# Patient Record
Sex: Female | Born: 1980 | Race: White | Hispanic: No | Marital: Married | Smoking: Former smoker
Health system: Southern US, Community
[De-identification: ages and names within clinical notes are randomized; demographics above are authoritative.]

## PROBLEM LIST (undated history)

## (undated) ENCOUNTER — Inpatient Hospital Stay (HOSPITAL_COMMUNITY): Payer: Self-pay

## (undated) DIAGNOSIS — K648 Other hemorrhoids: Secondary | ICD-10-CM

## (undated) DIAGNOSIS — G709 Myoneural disorder, unspecified: Secondary | ICD-10-CM

## (undated) DIAGNOSIS — Z8619 Personal history of other infectious and parasitic diseases: Secondary | ICD-10-CM

## (undated) DIAGNOSIS — C801 Malignant (primary) neoplasm, unspecified: Secondary | ICD-10-CM

## (undated) DIAGNOSIS — C3490 Malignant neoplasm of unspecified part of unspecified bronchus or lung: Secondary | ICD-10-CM

## (undated) DIAGNOSIS — R6882 Decreased libido: Secondary | ICD-10-CM

## (undated) HISTORY — PX: WISDOM TOOTH EXTRACTION: SHX21

## (undated) HISTORY — DX: Personal history of other infectious and parasitic diseases: Z86.19

## (undated) HISTORY — DX: Myoneural disorder, unspecified: G70.9

## (undated) HISTORY — DX: Malignant (primary) neoplasm, unspecified: C80.1

## (undated) HISTORY — DX: Decreased libido: R68.82

## (undated) HISTORY — PX: LUNG REMOVAL, PARTIAL: SHX233

## (undated) HISTORY — DX: Other hemorrhoids: K64.8

## (undated) HISTORY — DX: Malignant neoplasm of unspecified part of unspecified bronchus or lung: C34.90

---

## 2001-06-12 ENCOUNTER — Encounter: Admission: RE | Admit: 2001-06-12 | Discharge: 2001-06-12 | Payer: Self-pay | Admitting: Family Medicine

## 2001-06-12 ENCOUNTER — Encounter: Payer: Self-pay | Admitting: Family Medicine

## 2007-09-13 ENCOUNTER — Inpatient Hospital Stay (HOSPITAL_COMMUNITY): Admission: AD | Admit: 2007-09-13 | Discharge: 2007-09-16 | Payer: Self-pay | Admitting: Obstetrics and Gynecology

## 2007-09-13 ENCOUNTER — Inpatient Hospital Stay (HOSPITAL_COMMUNITY): Admission: AD | Admit: 2007-09-13 | Discharge: 2007-09-13 | Payer: Self-pay | Admitting: Obstetrics

## 2010-08-25 NOTE — Op Note (Signed)
NAMEGREGORY, Donna Higgins                 ACCOUNT NO.:  [id number]   MEDICAL RECORD NO.:  [id number]          PATIENT TYPE:  INP   LOCATION:  9124                          FACILITY:  [facility]   PHYSICIAN:  Gerri Spore B. Earlene Plater, M.D.  DATE OF BIRTH:  [birth year]   DATE OF PROCEDURE:  DATE OF DISCHARGE:                               OPERATIVE REPORT   PREOPERATIVE DIAGNOSES:  1. A 38-week intrauterine pregnancy.  2. Breech presentation.  3. Labor.   POSTOPERATIVE DIAGNOSES:  1. A 38-week intrauterine pregnancy.  2. Breech presentation.  3. Labor.   PROCEDURE:  Primary low-transverse cesarean section.   SURGEON:  Chester Holstein. Earlene Plater, MD   ASSISTANT:  Marlinda Mike, CNM   ANESTHESIA:  Spinal.   FINDINGS:  Homero Fellers breech female.  Apgars 9 and 9, pH 7.33, and weight 8  pounds 2 ounces.   SPECIMENS:  None.   BLOOD LOSS:  600.   COMPLICATIONS:  None.   FINDINGS:  Normal-appearing uterus, tubes, and ovaries.   INDICATIONS:  The patient presented at 38+ weeks, breech presentation,  and spontaneous labor.  Advised the risks of surgery including  infection, bleeding, and damage to surrounding organs.   PROCEDURE:  The patient was taken to the operating room and spinal  anesthesia was obtained.  Prepped and draped in standard fashion.  Foley  catheter inserted into the bladder.  A transverse incision was made in  lower abdomen and down to the fascia in the midline.  Just on each side  of the midline, the fascia was nicked with a scalpel.  The remainder of  the subcutaneous tissue was taken away bluntly and divided bluntly.  The  fascia was then opened bluntly.  Posterior sheath and peritoneum were  elevated and entered bluntly.  Bladder blade inserted and the uterus was  inspected.  Breech presentation was palpable and it felt like, not  enough room was present to allow easy delivery of the fetus, therefore,  the rectus muscle appeared to be the spot of resistance.  Therefore, the  middle third of each  rectus muscle was separated with the Bovie.  A  uterine incision was made in the midline in low transverse fashion with  a knife and extended laterally, bluntly.  Clear fluid at amniotomy.  The  breech was elevated to the incision and with fundal pressure delivered  without difficulty.  Each leg was delivered by flexion at the knees and  hips.  Traction was placed at the hips to deliver the infant to the  level of scapula, each arm was delivered by flexion of the elbow.  The  head was delivered by the flexion of the neck and fundal pressure.  Nose  and mouth suctioned with the bulbar cord clamped and cut.  Infant handed  off to awaiting pediatricians.  The patient was given 1 g of Ancef just  prior to skin incision.   Placenta was removed by uterine massage and cord traction.  The uterus  was exteriorized and cleared of all clots and debris.  Uterine incision  closed in a running lock and  did show zero chromic.  Hemostasis  obtained.  Tubes, ovaries, and uterus were normal.  Uterus was turned to  the abdomen.  Pelvis irrigated.  Uterine incision was hemostatic.  Fascia was closed in a running stitch of 0-Vicryl.  Subcutaneous tissue  was made hemostatic with the Bovie.  Skin was closed with staples.   The patient had tolerated the procedure well with no complications.  She  was taken to recovery room in awake, alert, and stable condition.  All  counts were correct per the operating room staff.      Gerri Spore B. Earlene Plater, M.D.  Electronically Signed     WBD/MEDQ  D:  09/13/2007  T:  09/14/2007  Job:  [id number]

## 2010-08-28 NOTE — Discharge Summary (Signed)
NAMECOURTLYN, AKI                 ACCOUNT NO.:  [id number]   MEDICAL RECORD NO.:  [id number]          PATIENT TYPE:  INP   LOCATION:  9124                          FACILITY:  [facility]   PHYSICIAN:  Gerri Spore B. Earlene Plater, M.D.  DATE OF BIRTH:  Mar 08, 1981   DATE OF ADMISSION:  09/13/2007  DATE OF DISCHARGE:  09/16/2007                               DISCHARGE SUMMARY   PREOPERATIVE DIAGNOSES:  1. 38 week intrauterine pregnancy.  2. Breech presentation.  3. Spontaneous labor.   DISCHARGE DIAGNOSES:  1. 38 week intrauterine pregnancy.  2. Breech presentation.  3. Spontaneous labor.   PROCEDURE:  Primary low transverse C-section.   HISTORY OF PRESENT ILLNESS:  A 30 year old white female, known breech  presentation, presented 38 weeks to the office in early labor.   HOSPITAL COURSE:  The patient was admitted, delivered by primary low  transverse C-section, frank breech female, 9 and 9 Apgars, pH 7.3, and  weight 8 pounds 2 ounces.   Postoperatively, the patient rapidly regained her ability to ambulate,  void, and tolerate regular diet.  She was discharged home on third  postoperative day in a satisfactory condition.   DISCHARGE INSTRUCTIONS:  Per booklet.   DISCHARGE MEDICATIONS:  Tylox 1-2 p.o. q.4 h. p.r.n. pain and Motrin 800  mg q.8 history. p.r.n. pain.   FOLLOWUP:  6 weeks.   DISPOSITION AT DISCHARGE:  Satisfactory.      Gerri Spore B. Earlene Plater, M.D.  Electronically Signed     WBD/MEDQ  D:  10/06/2007  T:  10/07/2007  Job:  [id number]

## 2011-01-07 LAB — CBC
HCT: 32.4 — ABNORMAL LOW
HCT: 39.5
Hemoglobin: 11.4 — ABNORMAL LOW
Hemoglobin: 13.7
MCHC: 34.7
MCHC: 35.1
MCV: 88.8
MCV: 89
Platelets: 321
Platelets: 345
RBC: 3.64 — ABNORMAL LOW
RBC: 4.45
RDW: 12.9
RDW: 13.3
WBC: 11.1 — ABNORMAL HIGH
WBC: 12.4 — ABNORMAL HIGH

## 2011-01-07 LAB — RPR: RPR Ser Ql: NONREACTIVE

## 2011-01-18 LAB — OB RESULTS CONSOLE GC/CHLAMYDIA
Chlamydia: NEGATIVE
Gonorrhea: NEGATIVE

## 2011-01-18 LAB — OB RESULTS CONSOLE HEPATITIS B SURFACE ANTIGEN: Hepatitis B Surface Ag: NEGATIVE

## 2011-01-18 LAB — OB RESULTS CONSOLE ANTIBODY SCREEN: Antibody Screen: NEGATIVE

## 2011-04-13 LAB — OB RESULTS CONSOLE RPR: RPR: NONREACTIVE

## 2011-04-13 NOTE — L&D Delivery Note (Signed)
Delivery Note  Complete dilation at 1808 Onset of pushing at 1830 FHR second stage - mild tachycardia at 160-175 / minimal to moderate variability / few mild variables Compound presentation with left hand over head - reduced past fetal head by Dr Billy Coast at 2015  Anesthesia: epidural  Delivery of a viable female at 2055 by CNM in ROA position.  Nuchal Cord none - noted short cord. Cord double clamped after cessation of pulsation, cut by FOB.  Cord blood sample collected.  Arterial cord blood sample sent.  Placenta delivered at 2100 intact with 3 VC.  Placenta to L&D for disposal. Uterine tone firm with small bleeding   2nd laceration identified.  Repair 3-0 vicryl interrupted deep closure standard fashion / 4-0 vicryl subcuticular Est. Blood Loss (mL): 300 ml  Complications: none  Small hard sebaceous cyst right vulva - unroofed with 18 gauge / drained moderate amount solidified drainage  Mom to postpartum.  Baby to Mom for bonding.  Marlinda Mike 08/21/2011, 9:28 PM

## 2011-07-13 LAB — OB RESULTS CONSOLE GBS: GBS: NEGATIVE

## 2011-08-11 ENCOUNTER — Inpatient Hospital Stay (HOSPITAL_COMMUNITY)
Admission: AD | Admit: 2011-08-11 | Discharge: 2011-08-12 | DRG: 382 | Disposition: A | Discharge status: Home / Self Care | Payer: BC Managed Care – PPO | Source: Ambulatory Visit | Attending: Obstetrics & Gynecology | Admitting: Obstetrics & Gynecology

## 2011-08-11 ENCOUNTER — Encounter (HOSPITAL_COMMUNITY): Payer: Self-pay | Admitting: *Deleted

## 2011-08-11 DIAGNOSIS — O479 False labor, unspecified: Principal | ICD-10-CM | POA: Diagnosis present

## 2011-08-11 DIAGNOSIS — O34219 Maternal care for unspecified type scar from previous cesarean delivery: Secondary | ICD-10-CM | POA: Diagnosis present

## 2011-08-11 DIAGNOSIS — Z98891 History of uterine scar from previous surgery: Secondary | ICD-10-CM

## 2011-08-11 LAB — CBC
HCT: 36.5 % (ref 36.0–46.0)
Hemoglobin: 12.1 g/dL (ref 12.0–15.0)
MCH: 29 pg (ref 26.0–34.0)
MCHC: 33.2 g/dL (ref 30.0–36.0)
MCV: 87.5 fL (ref 78.0–100.0)
Platelets: 403 10*3/uL — ABNORMAL HIGH (ref 150–400)
RBC: 4.17 MIL/uL (ref 3.87–5.11)
RDW: 13.4 % (ref 11.5–15.5)
WBC: 11.3 10*3/uL — ABNORMAL HIGH (ref 4.0–10.5)

## 2011-08-11 MED ORDER — SODIUM CHLORIDE 0.9 % IJ SOLN: 3.0000 mL | Freq: Two times a day (BID) | INTRAMUSCULAR | Status: DC

## 2011-08-11 MED ORDER — IBUPROFEN 600 MG PO TABS: 600.0000 mg | ORAL_TABLET | Freq: Four times a day (QID) | ORAL | Status: DC | PRN

## 2011-08-11 MED ORDER — SODIUM CHLORIDE 0.9 % IJ SOLN: 3.0000 mL | INTRAMUSCULAR | Status: DC | PRN

## 2011-08-11 MED ORDER — SODIUM CHLORIDE 0.9 % IJ SOLN
INTRAMUSCULAR | Status: AC
  Filled 2011-08-11: qty 3

## 2011-08-11 MED ORDER — OXYTOCIN BOLUS FROM INFUSION
500.0000 mL | Freq: Once | INTRAVENOUS | Status: DC
  Filled 2011-08-11: qty 500

## 2011-08-11 MED ORDER — ACETAMINOPHEN 325 MG PO TABS: 650.0000 mg | ORAL_TABLET | ORAL | Status: DC | PRN

## 2011-08-11 MED ORDER — CITRIC ACID-SODIUM CITRATE 334-500 MG/5ML PO SOLN: 30.0000 mL | ORAL | Status: DC | PRN

## 2011-08-11 MED ORDER — LACTATED RINGERS IV SOLN: 500.0000 mL | INTRAVENOUS | Status: DC | PRN

## 2011-08-11 MED ORDER — LACTATED RINGERS IV SOLN
INTRAVENOUS | Status: DC
  Administered 2011-08-12: via INTRAVENOUS

## 2011-08-11 MED ORDER — OXYTOCIN 20 UNITS IN LACTATED RINGERS INFUSION - SIMPLE: 125.0000 mL/h | Freq: Once | INTRAVENOUS | Status: DC

## 2011-08-11 MED ORDER — LIDOCAINE HCL (PF) 1 % IJ SOLN: 30.0000 mL | INTRAMUSCULAR | Status: DC | PRN

## 2011-08-11 NOTE — MAU Note (Signed)
C/o contractions since 0630; strong since 1800.

## 2011-08-11 NOTE — MAU Provider Note (Signed)
  History     CSN: [id number]  Arrival date and time: 08/11/11 1925 Provider seeing pt at 08/11/2011 at 1930     Chief Complaint  Patient presents with  . Labor Eval   HPI Ctx since 0600 - every 15 minutes until 3pm then every 5-10 minutes / painful ctx  Seen in office at noon today 2-3 cm / 80% / vtx -1                 - sent home with expectant management   No past medical history on file.  No past surgical history on file.  No family history on file.  History  Substance Use Topics  . Smoking status: Not on file  . Smokeless tobacco: Not on file  . Alcohol Use: Not on file    Allergies: Allergies not on file  No prescriptions prior to admission    ROS Physical Exam   Blood pressure 124/83, pulse 111, temperature 97.5 F (36.4 C), temperature source Oral, resp. rate 18, height 5\' 4"  (1.626 m), weight 77.384 kg (170 lb 9.6 oz).  Physical Exam Uncomfortable with ctx - pain 5/10 / planning epidural when she can have it No LOF Heavy show - thick brown to red color intermittently today FHR 150 Ctx every 5 minutes Cervix posterior / 4cm / 80% / vtx / 0 station / heavy show  MAU Course  Procedures   Assessment and Plan  37 6/7 latent labor - TOLAC Admit Epidural AROM after epidural  Kennisha Qin 08/11/2011, 8:04 PM

## 2011-08-11 NOTE — MAU Note (Signed)
Pt G2 P1, prev C/S, plans for VBAC, having contractions every x 1-2 hrs.

## 2011-08-11 NOTE — H&P (Signed)
  OB ADMISSION/ HISTORY & PHYSICAL:  Admission Date: 08/11/2011  7:25 PM  Admit Diagnosis: latent labor / previous cesarean section - desires TOLAC  Donna Higgins is a 31 y.o. female presenting for onset of labor  - ctx since 0600 / painful since 1500  Prenatal History: G2P1 EDC : 08/27/2011, Alternate EDD Entry  Prenatal care at [facility] - primary ob provider Marlinda Mike CNM  Prenatal course complicated by previous CS for breech at term / threatened PTL.  Prenatal Labs: ABO, Rh:   O positive Antibody:  Negative Rubella:   Immune RPR:   NR HBsAg:   negative HIV:   NR GBS:   Negative 1 hr Glucola : NL  Medical / Surgical History :  Past medical history: No past medical history on file.   Past surgical history: No past surgical history on file.  Cesarean section - 38 weeks with SROM and breech.  Family History: No family history on file.   Social History:  does not have a smoking history on file. She does not have any smokeless tobacco history on file. Her alcohol and drug histories not on file.   Allergies: Review of patient's allergies indicates not on file.    Current Medications at time of admission:  Current facility-administered medications:sodium chloride 0.9 % injection, , , ,     Review of Systems: Ctx x 12 hours + bloody show  Physical Exam: General: uncomfortable with ctx Heart: RR - mild tachycardia at 112 Lungs: breathing with ctx Abdomen: gravid / non-tender Extremities: no edema FHR: 150  / + accels / no decels / category 1 TOCO: every 2-5 minutes  Dilation: 4 Effacement (%): 80 Station: -1  VS: 97.5 - 111-18- 124/83   Assessment: 37 5/7 latent labor Previous CS - planning TOLAC Negative GBS  Plan:  Admit Epidural in active labor  Dr Juliene Pina update with admission and plan of care  Marlinda Mike 08/11/2011, 8:12 PM

## 2011-08-12 LAB — RPR: RPR Ser Ql: NONREACTIVE

## 2011-08-12 MED ORDER — PROMETHAZINE HCL 25 MG/ML IJ SOLN
12.5000 mg | Freq: Once | INTRAMUSCULAR | Status: AC
  Administered 2011-08-12: 12.5 mg via INTRAVENOUS
  Filled 2011-08-12: qty 1

## 2011-08-12 MED ORDER — BUTORPHANOL TARTRATE 2 MG/ML IJ SOLN
2.0000 mg | Freq: Once | INTRAMUSCULAR | Status: AC
  Administered 2011-08-12: 2 mg via INTRAVENOUS
  Filled 2011-08-12: qty 1

## 2011-08-12 NOTE — Progress Notes (Signed)
S: Feeling strong ctx / walking  / sitting on birth ball      Tolerating contractions well - breathing with ctx / really tired      Red bloody mucus in bathroom - no LOF      Request to be rechecked  O:  VS: Blood pressure 110/80, pulse 100, temperature 97.8 F (36.6 C), temperature source Oral, resp. rate 20, height 5\' 4"  (1.626 m), weight 77.111 kg (170 lb).        FHR : baseline 150 / variability moderate / accels + / decels none        Toco: contractions every 2-4 minutes / moderate to strong         Cervix :very posterior /  4 / 80% / vtx / - 1 / BBOW / heavy show     A: latent labor / TOLAC     FHR category 1  P: Encouraged IV analgesia      Rest for 1-4 hours      Recheck if ctx too uncomfortable to sleep      Epidural with cervical progression or SROM     Rekita Miotke 08/12/2011, 12:39 AM

## 2011-08-12 NOTE — MAU Provider Note (Signed)
Reviewed, agree with note and plan.Hilary Hertz, MD

## 2011-08-12 NOTE — H&P (Signed)
Reviewed, discussed, agree--V.Juliene Pina, MD

## 2011-08-12 NOTE — Progress Notes (Signed)
S: Feeling ok / slept some / walking ad lib now     Contractions not painful any longer - not regular at all     Tearful and frustrated that labor has stopped                     -  wants to know what she can do to increase labor       O:  VS: Blood pressure 106/74, pulse 95, temperature 97.8 F (36.6 C), temperature source Oral, resp. rate 20, height 5\' 4"  (1.626 m), weight 77.111 kg (170 lb).         FHR : baseline 135 / variability moderate / accels + / decels none        Toco: contractions irregular every 10-20 minutes / mild         Cervix : defer repeat exam         Membranes: intact        Only brown discharge this am  Abdomen soft and non-tender / uterus non-tender Extremities - no edema  A:  False labor / TOLAC      37 6/7 weeks      FHR category 1  P: Discussed late preterm status and TOLAC -              Late preterm babies at risk for NICU admit              TOLAC best success with spontaneous labor without pitocin              Augmentation would be AROM and most likely pitocin due to current ctx pattern              Do NOT recommend augmentation just because admitted                Recommend discharge home / expectant management              OOW note for yesterday and today / recheck at [facility] tomorrow              Labor will restart - unable to know when / call if increase labor symptoms for recheck  Patient and spouse understand rationale for decision - agrees to plan of care. Tearful and frustrated - encouraged patience for best outcome for both Mom and baby.  Marlinda Mike 08/12/2011, 8:21 AM

## 2011-08-12 NOTE — Discharge Summary (Signed)
POSTOPERATIVE DISCHARGE SUMMARY:  Patient ID: Donna Higgins MRN: [id number] DOB/AGE: November 19, 1980 31 y.o.  Admit date: 08/11/2011 Discharge date:  08/12/2011  Admission Diagnoses: 37 5/7 onset of labor / previous cesarean section - desires TOLAC    Discharge Diagnoses:   False Labor  Prenatal history: G2P1001   EDC : 08/27/2011, Alternate EDD Entry  Prenatal care at [facility]  Primary provider : Marlinda Mike CNM Prenatal course complicated by previous CS at 38 weeks breech in labor / exposure to Parvo (negative screening)  Prenatal Labs: ABO, Rh: O (10/08 0000) positive Antibody: Negative (10/08 0000) Rubella: Immune (10/08 0000)  RPR: NON REACTIVE (05/01 2030)  HBsAg: Negative (10/08 0000)  HIV: Non-reactive (10/02 0000)  GBS: Negative (04/02 0000)  1 hr Glucola : nl   Medical / Surgical History :  Past medical history: History reviewed. No pertinent past medical history.  Past surgical history:  Past Surgical History  Procedure Date  . Cesarean section     Family History: History reviewed. No pertinent family history.  Social History:  reports that she has never smoked. She does not have any smokeless tobacco history on file. She reports that she does not drink alcohol or use illicit drugs.   Allergies: Review of patient's allergies indicates no known allergies.    Current Medications at time of admission:  Prior to Admission medications   Medication Sig Start Date End Date Taking? Authorizing Provider  acetaminophen (TYLENOL) 500 MG tablet Take 500 mg by mouth every 6 (six) hours as needed. Head/ back ache   Yes Historical Provider, MD  calcium carbonate (TUMS - DOSED IN MG ELEMENTAL CALCIUM) 500 MG chewable tablet Chew 1 tablet by mouth daily. Acid reflux   Yes Historical Provider, MD  Prenatal Vit-Fe Fumarate-FA (PRENATAL MULTIVITAMIN) TABS Take 1 tablet by mouth daily.   Yes Historical Provider, MD    Hospital Course:  Admitted with regular  painful ctx for over 12 hours / bloody show / cervical change from 2cm to 4cm. Previous CS at 38 weeks for SROM & spontaneous labor with breech. Desires TOLAC. Expectant management with plan epidural in active labor and AROM after epidural.  Arrest of cervical progression despite ctx every 2-4 minutes for over 6 hours. Stadol and phenergan sedation given - resolution of regular ctx while slept. Decision to discharge home rather than augment labor due to rare ctx pattern on am after admission, later preterm status, and TOLAC.    Physical Exam:   Alert and oriented / NAD  Abdomen soft and non-tender / uterus non-tender Cervix very posterior / 4cm / 80 % / vtx / -1 - no progression after admission FHR category 1 CTX rare every 10-20 minutes and mild  VSS: Blood pressure 106/74, pulse 95, temperature 97.8 F (36.6 C), temperature source Oral, resp. rate 20, height 5\' 4"  (1.626 m), weight 77.111 kg (170 lb).  LABS: WBC/Hgb/Hct/Plts:  11.3/12.1/36.5/403 (05/01 2030)   Discharge Instructions:  Discharged Condition: stable Activity: unrestricted Diet: routine Medications: see below Medication List  As of 08/12/2011  8:40 AM   TAKE these medications         acetaminophen 500 MG tablet   Commonly known as: TYLENOL   Take 500 mg by mouth every 6 (six) hours as needed. Head/ back ache      calcium carbonate 500 MG chewable tablet   Commonly known as: TUMS - dosed in mg elemental calcium   Chew 1 tablet by mouth daily. Acid reflux  prenatal multivitamin Tabs   Take 1 tablet by mouth daily.           Condition: stable Discharge to: home Disposition:  Follow up :  Follow-up Information    Follow up with Marlinda Mike, CNM. Schedule an appointment as soon as possible for a visit in 1 day.   Contact information:   [address] [place] [address] [phone]           Signed: Marlinda Mike 08/12/2011, 8:40 AM

## 2011-08-12 NOTE — Progress Notes (Signed)
Wiliam Ke, CNM phoned about patient nausea.  New order received for one time dose of phenergan 12.5 mg IV.  No other orders received at this time.

## 2011-08-12 NOTE — Discharge Summary (Signed)
Reviewed, agree./ V.Wess Baney, MD 

## 2011-08-12 NOTE — Progress Notes (Signed)
CNm and pt talking about poc, decide for pt to take some iv pain medication and rest for a few hrs

## 2011-08-16 NOTE — Progress Notes (Signed)
UR chart review completed.  

## 2011-08-18 ENCOUNTER — Telehealth (HOSPITAL_COMMUNITY): Payer: Self-pay | Admitting: *Deleted

## 2011-08-18 ENCOUNTER — Encounter (HOSPITAL_COMMUNITY): Payer: Self-pay | Admitting: *Deleted

## 2011-08-18 NOTE — Telephone Encounter (Signed)
Preadmission screen  

## 2011-08-19 ENCOUNTER — Other Ambulatory Visit: Payer: Self-pay

## 2011-08-21 ENCOUNTER — Inpatient Hospital Stay (HOSPITAL_COMMUNITY): Payer: BC Managed Care – PPO | Admitting: Anesthesiology

## 2011-08-21 ENCOUNTER — Encounter (HOSPITAL_COMMUNITY): Payer: Self-pay

## 2011-08-21 ENCOUNTER — Encounter (HOSPITAL_COMMUNITY): Payer: Self-pay | Admitting: Anesthesiology

## 2011-08-21 ENCOUNTER — Inpatient Hospital Stay (HOSPITAL_COMMUNITY)
Admission: RE | Admit: 2011-08-21 | Discharge: 2011-08-23 | DRG: 373 | Disposition: A | Discharge status: Home / Self Care | Payer: BC Managed Care – PPO | Source: Ambulatory Visit | Attending: Obstetrics and Gynecology | Admitting: Obstetrics and Gynecology

## 2011-08-21 DIAGNOSIS — O328XX0 Maternal care for other malpresentation of fetus, not applicable or unspecified: Secondary | ICD-10-CM | POA: Diagnosis present

## 2011-08-21 DIAGNOSIS — Z98891 History of uterine scar from previous surgery: Secondary | ICD-10-CM

## 2011-08-21 DIAGNOSIS — O34219 Maternal care for unspecified type scar from previous cesarean delivery: Secondary | ICD-10-CM | POA: Diagnosis not present

## 2011-08-21 LAB — CBC
HCT: 36.9 % (ref 36.0–46.0)
Hemoglobin: 12.3 g/dL (ref 12.0–15.0)
MCH: 29.1 pg (ref 26.0–34.0)
MCHC: 33.3 g/dL (ref 30.0–36.0)
MCV: 87.4 fL (ref 78.0–100.0)
Platelets: 403 10*3/uL — ABNORMAL HIGH (ref 150–400)
RBC: 4.22 MIL/uL (ref 3.87–5.11)
RDW: 13.3 % (ref 11.5–15.5)
WBC: 11.5 10*3/uL — ABNORMAL HIGH (ref 4.0–10.5)

## 2011-08-21 LAB — RPR: RPR Ser Ql: NONREACTIVE

## 2011-08-21 MED ORDER — HYDROCORTISONE 2.5 % RE CREA
TOPICAL_CREAM | Freq: Three times a day (TID) | RECTAL | Status: DC
  Administered 2011-08-22 (×2): 1 via RECTAL
  Filled 2011-08-21: qty 28.35

## 2011-08-21 MED ORDER — OXYTOCIN 20 UNITS IN LACTATED RINGERS INFUSION - SIMPLE
125.0000 mL/h | Freq: Once | INTRAVENOUS | Status: AC
  Administered 2011-08-21: 999 mL/h via INTRAVENOUS

## 2011-08-21 MED ORDER — DIBUCAINE 1 % RE OINT: 1.0000 "application " | TOPICAL_OINTMENT | RECTAL | Status: DC | PRN

## 2011-08-21 MED ORDER — LACTATED RINGERS IV SOLN
INTRAVENOUS | Status: DC
  Administered 2011-08-21 (×2): 125 mL/h via INTRAVENOUS

## 2011-08-21 MED ORDER — EPHEDRINE 5 MG/ML INJ: 10.0000 mg | INTRAVENOUS | Status: DC | PRN

## 2011-08-21 MED ORDER — CITRIC ACID-SODIUM CITRATE 334-500 MG/5ML PO SOLN: 30.0000 mL | ORAL | Status: DC | PRN

## 2011-08-21 MED ORDER — SENNOSIDES-DOCUSATE SODIUM 8.6-50 MG PO TABS
2.0000 | ORAL_TABLET | Freq: Every day | ORAL | Status: DC
  Administered 2011-08-22: 2 via ORAL

## 2011-08-21 MED ORDER — LIDOCAINE HCL (PF) 1 % IJ SOLN: 30.0000 mL | INTRAMUSCULAR | Status: DC | PRN

## 2011-08-21 MED ORDER — PHENYLEPHRINE 40 MCG/ML (10ML) SYRINGE FOR IV PUSH (FOR BLOOD PRESSURE SUPPORT)
80.0000 ug | PREFILLED_SYRINGE | INTRAVENOUS | Status: DC | PRN
  Filled 2011-08-21: qty 5

## 2011-08-21 MED ORDER — LACTATED RINGERS IV SOLN
500.0000 mL | INTRAVENOUS | Status: DC | PRN
  Administered 2011-08-21: 300 mL via INTRAVENOUS

## 2011-08-21 MED ORDER — PHENYLEPHRINE 40 MCG/ML (10ML) SYRINGE FOR IV PUSH (FOR BLOOD PRESSURE SUPPORT): 80.0000 ug | PREFILLED_SYRINGE | INTRAVENOUS | Status: DC | PRN

## 2011-08-21 MED ORDER — PRENATAL MULTIVITAMIN CH
1.0000 | ORAL_TABLET | Freq: Every day | ORAL | Status: DC
  Administered 2011-08-22 – 2011-08-23 (×2): 1 via ORAL
  Filled 2011-08-21 (×2): qty 1

## 2011-08-21 MED ORDER — LANOLIN HYDROUS EX OINT: TOPICAL_OINTMENT | CUTANEOUS | Status: DC | PRN

## 2011-08-21 MED ORDER — OXYTOCIN BOLUS FROM INFUSION
500.0000 mL | Freq: Once | INTRAVENOUS | Status: DC
  Filled 2011-08-21: qty 500

## 2011-08-21 MED ORDER — DIPHENHYDRAMINE HCL 50 MG/ML IJ SOLN: 12.5000 mg | INTRAMUSCULAR | Status: DC | PRN

## 2011-08-21 MED ORDER — FENTANYL 2.5 MCG/ML BUPIVACAINE 1/10 % EPIDURAL INFUSION (WH - ANES)
14.0000 mL/h | INTRAMUSCULAR | Status: DC
  Administered 2011-08-21: 14 mL/h via EPIDURAL
  Filled 2011-08-21 (×2): qty 60

## 2011-08-21 MED ORDER — LIDOCAINE HCL (PF) 1 % IJ SOLN
INTRAMUSCULAR | Status: DC | PRN
  Administered 2011-08-21 (×2): 4 mL

## 2011-08-21 MED ORDER — FENTANYL 2.5 MCG/ML BUPIVACAINE 1/10 % EPIDURAL INFUSION (WH - ANES)
INTRAMUSCULAR | Status: DC | PRN
  Administered 2011-08-21: 14 mL/h via EPIDURAL

## 2011-08-21 MED ORDER — EPHEDRINE 5 MG/ML INJ
10.0000 mg | INTRAVENOUS | Status: DC | PRN
  Filled 2011-08-21: qty 4

## 2011-08-21 MED ORDER — ONDANSETRON HCL 4 MG/2ML IJ SOLN: 4.0000 mg | INTRAMUSCULAR | Status: DC | PRN

## 2011-08-21 MED ORDER — BENZOCAINE-MENTHOL 20-0.5 % EX AERO
1.0000 "application " | INHALATION_SPRAY | CUTANEOUS | Status: DC | PRN
  Administered 2011-08-22 – 2011-08-23 (×2): 1 via TOPICAL
  Filled 2011-08-21 (×2): qty 56

## 2011-08-21 MED ORDER — SIMETHICONE 80 MG PO CHEW: 80.0000 mg | CHEWABLE_TABLET | ORAL | Status: DC | PRN

## 2011-08-21 MED ORDER — OXYTOCIN 20 UNITS IN LACTATED RINGERS INFUSION - SIMPLE
1.0000 m[IU]/min | INTRAVENOUS | Status: DC
  Administered 2011-08-21: 2 m[IU]/min via INTRAVENOUS
  Filled 2011-08-21: qty 1000

## 2011-08-21 MED ORDER — LACTATED RINGERS IV SOLN
500.0000 mL | Freq: Once | INTRAVENOUS | Status: AC
  Administered 2011-08-21: 500 mL via INTRAVENOUS

## 2011-08-21 MED ORDER — IBUPROFEN 600 MG PO TABS
600.0000 mg | ORAL_TABLET | Freq: Four times a day (QID) | ORAL | Status: DC
  Administered 2011-08-22 – 2011-08-23 (×5): 600 mg via ORAL
  Filled 2011-08-21 (×5): qty 1

## 2011-08-21 MED ORDER — DIPHENHYDRAMINE HCL 25 MG PO CAPS: 25.0000 mg | ORAL_CAPSULE | Freq: Four times a day (QID) | ORAL | Status: DC | PRN

## 2011-08-21 MED ORDER — ACETAMINOPHEN 325 MG PO TABS: 650.0000 mg | ORAL_TABLET | ORAL | Status: DC | PRN

## 2011-08-21 MED ORDER — ONDANSETRON HCL 4 MG/2ML IJ SOLN
4.0000 mg | Freq: Four times a day (QID) | INTRAMUSCULAR | Status: DC | PRN
  Administered 2011-08-21: 4 mg via INTRAVENOUS
  Filled 2011-08-21: qty 2

## 2011-08-21 MED ORDER — WITCH HAZEL-GLYCERIN EX PADS: 1.0000 "application " | MEDICATED_PAD | CUTANEOUS | Status: DC | PRN

## 2011-08-21 MED ORDER — IBUPROFEN 600 MG PO TABS
600.0000 mg | ORAL_TABLET | Freq: Four times a day (QID) | ORAL | Status: DC | PRN
  Administered 2011-08-21: 600 mg via ORAL
  Filled 2011-08-21: qty 1

## 2011-08-21 MED ORDER — ONDANSETRON HCL 4 MG PO TABS: 4.0000 mg | ORAL_TABLET | ORAL | Status: DC | PRN

## 2011-08-21 MED ORDER — OXYCODONE-ACETAMINOPHEN 5-325 MG PO TABS
1.0000 | ORAL_TABLET | ORAL | Status: DC | PRN
  Administered 2011-08-22: 1 via ORAL
  Administered 2011-08-22: 2 via ORAL
  Administered 2011-08-22: 1 via ORAL
  Administered 2011-08-23: 2 via ORAL
  Administered 2011-08-23: 1 via ORAL
  Filled 2011-08-21: qty 2
  Filled 2011-08-21 (×3): qty 1
  Filled 2011-08-21: qty 2

## 2011-08-21 NOTE — Progress Notes (Signed)
Patient ID: Donna Higgins, female   DOB: April 18, 1980, 31 y.o.   MRN: [id number]  S: Feeling pressure - urge to push     Pushing with ctx since 1830  O:  VS: Blood pressure 99/68, pulse 116, temperature 99.9 F (37.7 C), temperature source Axillary, resp. rate 18, height 5\' 4"  (1.626 m), weight 76.204 kg (168 lb).        FHR : baseline 155 / variability mod / accels + / decels occasional mild variable        Toco: contractions every 2-4 minutes / pitocin at 10 mu/min         Compound presentation - only hand presenting with vtx - arm no longer palpable        ROA - Left hand presenting / + 3 movement with maternal pushing        Fetal fingers visible with pushing - mobile / pink color        A: Active labor - second stage     TOLAC     Compound presentation     FHR category 1  P: continue active second stage       Dr Billy Coast - updated  / requested to come evaluate progress - possibility of VBAC                            Stand-by delivery with compound presentation (delivery assist or repair assist)   Marlinda Mike CNM

## 2011-08-21 NOTE — Progress Notes (Signed)
Patient ID: Donna Higgins, female   DOB: Jan 31, 1981, 31 y.o.   MRN: [id number]  S: Feeling pressure with ctx     Tolerating contractions well since epidural   O:  VS: Blood pressure 92/55, pulse 94, temperature 97.8 F (36.6 C), temperature source Oral, resp. rate 20, height 5\' 4"  (1.626 m), weight 76.204 kg (168 lb).        FHR : baseline 140 / variability moderate / accels +  / decels none        Toco: contractions every 2-3 minutes / moderate / pitocin at 8 mu/min         Cervix : 8 / 100% / vtx with compound presentation - arm and hand over top of vtx  / + 1                     Reduced hand back to level of cephalic prominence - reposition Right lateral        Membranes: clear fluid / bloody show  A: Active labor - TOLAC     Compound presentation - reduced easily but uncertain if will remain behind fetal head for delivery     FHR category 1  P: Expectant management     Dr Billy Coast updated with compound presentation                         - reduced behind vtx but uncertain if will remain behind vtx for delivery    Recheck in 30-60 minutes -monitor closely     Donna Higgins 08/21/2011, 5:19 PM

## 2011-08-21 NOTE — Anesthesia Procedure Notes (Signed)
Epidural Patient location during procedure: OB Start time: 08/21/2011 1:42 PM  Staffing Anesthesiologist: Sabeen Piechocki A. Performed by: anesthesiologist   Preanesthetic Checklist Completed: patient identified, site marked, surgical consent, pre-op evaluation, timeout performed, IV checked, risks and benefits discussed and monitors and equipment checked  Epidural Patient position: sitting Prep: site prepped and draped and DuraPrep Patient monitoring: continuous pulse ox and blood pressure Approach: midline Injection technique: LOR air  Needle:  Needle type: Tuohy  Needle gauge: 17 G Needle length: 9 cm Needle insertion depth: 5 cm cm Catheter type: closed end flexible Catheter size: 19 Gauge Catheter at skin depth: 10 cm Test dose: negative and Other  Assessment Events: blood not aspirated, injection not painful, no injection resistance, negative IV test and no paresthesia  Additional Notes Patient identified. Risks and benefits discussed including failed block, incomplete  Pain control, post dural puncture headache, nerve damage, paralysis, blood pressure Changes, nausea, vomiting, reactions to medications-both toxic and allergic and post Partum back pain. All questions were answered. Patient expressed understanding and wished to proceed. Sterile technique was used throughout procedure. Epidural site was Dressed with sterile barrier dressing. No paresthesias, signs of intravascular injection Or signs of intrathecal spread were encountered.  Patient was more comfortable after the epidural was dosed. Please see RN's note for documentation of vital signs and FHR which are stable.

## 2011-08-21 NOTE — Anesthesia Preprocedure Evaluation (Signed)
Anesthesia Evaluation  Patient identified by MRN, date of birth, ID band Patient awake    Reviewed: Allergy & Precautions, H&P , Patient's Chart, lab work & pertinent test results  Airway Mallampati: II TM Distance: >3 FB Neck ROM: full    Dental No notable dental hx. (+) Teeth Intact   Pulmonary neg pulmonary ROS,  breath sounds clear to auscultation  Pulmonary exam normal       Cardiovascular negative cardio ROS  Rhythm:regular Rate:Normal     Neuro/Psych negative neurological ROS  negative psych ROS   GI/Hepatic negative GI ROS, Neg liver ROS,   Endo/Other  negative endocrine ROS  Renal/GU negative Renal ROS  negative genitourinary   Musculoskeletal   Abdominal Normal abdominal exam  (+)   Peds  Hematology negative hematology ROS (+)   Anesthesia Other Findings   Reproductive/Obstetrics (+) Pregnancy Hx/o Previous C/Section.                           Anesthesia Physical Anesthesia Plan  ASA: II  Anesthesia Plan: Epidural   Post-op Pain Management:    Induction:   Airway Management Planned:   Additional Equipment:   Intra-op Plan:   Post-operative Plan:   Informed Consent: I have reviewed the patients History and Physical, chart, labs and discussed the procedure including the risks, benefits and alternatives for the proposed anesthesia with the patient or authorized representative who has indicated his/her understanding and acceptance.     Plan Discussed with: Anesthesiologist  Anesthesia Plan Comments:         Anesthesia Quick Evaluation

## 2011-08-21 NOTE — Progress Notes (Signed)
Patient ID: PYPER OLEXA, female   DOB: 12/29/1980, 31 y.o.   MRN: [id number]   S: Feeling stronger ctx / not painful yet     Tolerating contractions well   O:  VS: Blood pressure 106/74, pulse 108, temperature 98.5 F (36.9 C), temperature source Oral, resp. rate 16, height 5\' 4"  (1.626 m), weight 76.204 kg (168 lb).        FHR : baseline 140 / variability moderate / accels +  / decels none        Toco: contractions every 2-3 minutes / pitocin 74mu/min         Cervix : 4/ 80 / vtx / 0 / very posterior cervix / BBOW        Membranes: AROM - clear  A: latent labor     FHR category 1  P: recheck 2 hours     Breslin Hemann 08/21/2011, 1:03 PM

## 2011-08-21 NOTE — Progress Notes (Signed)
Pt states she is feeling a lot of pressure T Fredric Mare CNM notified

## 2011-08-21 NOTE — H&P (Signed)
  OB ADMISSION/ HISTORY & PHYSICAL:  Admission Date: 08/21/2011  6:48 AM  Admit Diagnosis: 39 weeks / advanced dilation / TOLAC / elective induction   Donna Higgins is a 31 y.o. female presenting for labor induction / TOLAC  Prenatal History: G2P1001   EDC : 08/27/2011, Alternate EDD Entry  Prenatal care at [facility] - primary provider Marlinda Mike CNM  Prenatal course complicated by previous CS (breech with SROM at 38 wk) / threatened PTL / False labor at 37wk with dilation to 4cm  Prenatal Labs: ABO, Rh: O (10/08 0000) positive Antibody: Negative (10/08 0000) Rubella: Immune (10/08 0000)  RPR: NON REACTIVE (05/01 2030)  HBsAg: Negative (10/08 0000)  HIV: Non-reactive (10/02 0000)  GBS: Negative (04/02 0000)  1 hr Glucola : NL   Medical / Surgical History :  Past medical history:  Past Medical History  Diagnosis Date  . Internal hemorrhoids without mention of complication   . Decreased libido   . History of chicken pox      Past surgical history:  Past Surgical History  Procedure Date  . Cesarean section   . Wisdom tooth extraction      Family History:  Family History  Problem Relation Age of Onset  . Urolithiasis Mother   . Hypertension Mother   . Cancer Maternal Aunt     breast  . Diabetes Maternal Grandmother     Social History:  reports that she has never smoked. She has never used smokeless tobacco. She reports that she does not drink alcohol or use illicit drugs.  Allergies: Stadol   Current Medications at time of admission:  Prenatal vitamin  Review of Systems: Irregular ctx No LOF / no bleeding + FM  Physical Exam:  VS: 98.2 - 111 - 18 - 122/72  Dilation: 4 Effacement (%): 80 Station: -1 Exam by:: Wiliam Ke CNM  General: slightly anxious / NAD / calm Heart: RR - mild tachycardia Lungs: unlabored Abdomen: gravid / non-tender Extremities: trace edema  FHR: cat 1 with baseline  / no decels / + accels TOCO: ctx  every 5-8 minutes mild   Assessment: 39 weeks - previous CS / desires TOLAC Favorable Bishops Negative GBS Elective induction -  false labor 37-38wk / advanced dilation at 4cm  Plan:  Admit Pitocin - low dose  AROM - IUPC Epidural in labor Attempt VBAC  Marlinda Mike 08/21/2011, 9:03 AM

## 2011-08-22 DIAGNOSIS — O34219 Maternal care for unspecified type scar from previous cesarean delivery: Secondary | ICD-10-CM | POA: Diagnosis not present

## 2011-08-22 LAB — CBC
HCT: 31.8 % — ABNORMAL LOW (ref 36.0–46.0)
Hemoglobin: 10.7 g/dL — ABNORMAL LOW (ref 12.0–15.0)
MCH: 29.3 pg (ref 26.0–34.0)
MCHC: 33.6 g/dL (ref 30.0–36.0)
MCV: 87.1 fL (ref 78.0–100.0)
Platelets: 341 10*3/uL (ref 150–400)
RBC: 3.65 MIL/uL — ABNORMAL LOW (ref 3.87–5.11)
RDW: 13.2 % (ref 11.5–15.5)
WBC: 16.7 10*3/uL — ABNORMAL HIGH (ref 4.0–10.5)

## 2011-08-22 NOTE — Anesthesia Postprocedure Evaluation (Signed)
Anesthesia Post Note  Patient: Donna Higgins  Procedure(s) Performed: * No procedures listed *  Anesthesia type: Epidural  Patient location: Mother/Baby  Post pain: Pain level controlled  Post assessment: Post-op Vital signs reviewed  Last Vitals:  Filed Vitals:   08/22/11 0540  BP: 99/64  Pulse: 99  Temp: 36.5 C  Resp: 18    Post vital signs: Reviewed  Level of consciousness: awake  Complications: No apparent anesthesia complications

## 2011-08-22 NOTE — Progress Notes (Signed)
Patient ID: LANGSTON SUMMERFIELD, female   DOB: 1981/03/27, 31 y.o.   MRN: [id number]  PPD 1 SVD / VBAC  S:  Reports feeling well - mild cramps and soreness in bottom             Tolerating po/ No nausea or vomiting             Bleeding is light             Pain controlled with motrin and percocet as needed             Up ad lib / ambulatory  Newborn breast feeding   / female - "Piper"   O:  A & O x 3 NAD             VS: Blood pressure 99/64, pulse 99, temperature 97.7 F (36.5 C), temperature source Oral, resp. rate 18, height 5\' 4"  (1.626 m), weight 76.204 kg (168 lb), unknown if currently breastfeeding.  LABS: WBC/Hgb/Hct/Plts:  16.7/10.7/31.8/341 (05/12 0534)   Lungs: Clear and unlabored  Heart: regular rate and rhythm / no mumurs  Abdomen: soft, non-tender, non-distended              Fundus: firm, non-tender, U-1  Perineum: moderate edema - ice pack in place  Lochia: light  Extremities: no edema, no calf pain or tenderness    A: PPD # 1   Doing well - stable status  P:  Routine post partum orders  Anticipate discharge in am  Marlinda Mike 08/22/2011, 8:01 AM

## 2011-08-23 MED ORDER — HYDROCORTISONE 2.5 % RE CREA: TOPICAL_CREAM | Freq: Three times a day (TID) | RECTAL | Status: AC

## 2011-08-23 MED ORDER — OXYCODONE-ACETAMINOPHEN 5-325 MG PO TABS: 1.0000 | ORAL_TABLET | ORAL | Status: AC | PRN

## 2011-08-23 MED ORDER — TETANUS-DIPHTH-ACELL PERTUSSIS 5-2.5-18.5 LF-MCG/0.5 IM SUSP
0.5000 mL | Freq: Once | INTRAMUSCULAR | Status: AC
  Administered 2011-08-23: 0.5 mL via INTRAMUSCULAR

## 2011-08-23 MED ORDER — IBUPROFEN 600 MG PO TABS: 600.0000 mg | ORAL_TABLET | Freq: Four times a day (QID) | ORAL | Status: AC

## 2011-08-23 NOTE — Discharge Summary (Signed)
OBSTETRICAL DISCHARGE SUMMARY   Patient ID: Donna Higgins MRN: [id number] DOB/AGE: 09-27-1980 31 y.o.  Admit date: 08/21/2011 Discharge date:  08/23/2011  Admission Diagnoses: 39 weeks advanced dilation / previous Cesarean section - desires TOLAC   Discharge Diagnoses:   Term Pregnancy-delivered VBAC - PPD 2  Reason for Admission: induction of labor  Prenatal history: G2P2002   EDC : 08/27/2011, Alternate EDD Entry  Prenatal care at [facility]  Primary provider : Marlinda Mike CNM Prenatal course complicated by previous cesarean section  Prenatal Labs: ABO, Rh: --/--/O POS (05/11 0720)  Antibody: Negative (10/08 0000) Rubella:   immune RPR: NON REACTIVE (05/11 0730)  HBsAg: Negative (10/08 0000)  HIV: Non-reactive (10/02 0000)  GBS: Negative (04/02 0000)    Labor Summary: CUI is required    Anesthesia: epidural Procedures: none Complications: none  Newborn Data:  Gender: female Feeding method : breast Circumcision: n/A Home with mother.   Discharge Information:  Activity: pelvic rest Diet: routine Medications: PNV, Percocet and anusol HC Condition: stable Instructions: refer to practice specific booklet Discharge to: home Follow up : [facility] at 6 weeks postpartum  Signed: Marlinda Mike 08/23/2011, 8:50 AM

## 2011-08-23 NOTE — Progress Notes (Signed)
PPD 2 SVD  S:  Reports feeling well             Tolerating po/ No nausea or vomiting             Bleeding is light             Pain controlled withmotrin and percocet             Up ad lib / ambulatory  Newborn breast feeding     O:  A & O x 3 NAD             VS: Blood pressure 96/65, pulse 85, temperature 97.5 F (36.4 C), temperature source Oral, resp. rate 18, height 5\' 4"  (1.626 m), weight 168 lb (76.204 kg), unknown if currently breastfeeding.  Lungs: Clear and unlabored  Heart: regular rate and rhythm / no mumurs  Abdomen: soft, non-tender, non-distended              Fundus: firm, non-tender, U-2  Perineum: decreased edema  Lochia: light  Extremities: no edema, no calf pain or tenderness    A: PPD # 2   Doing well - stable status  P:  Routine post partum orders  Discharge to home  Donna Higgins 08/23/2011, 8:36 AM

## 2012-02-02 ENCOUNTER — Encounter: Payer: Self-pay | Admitting: Family Medicine

## 2012-04-12 LAB — HM PAP SMEAR: HM Pap smear: NORMAL

## 2012-04-19 ENCOUNTER — Ambulatory Visit: Payer: BC Managed Care – PPO | Admitting: Family Medicine

## 2012-05-22 ENCOUNTER — Ambulatory Visit (INDEPENDENT_AMBULATORY_CARE_PROVIDER_SITE_OTHER): Payer: BC Managed Care – PPO | Admitting: Family Medicine

## 2012-05-22 ENCOUNTER — Encounter: Payer: Self-pay | Admitting: Family Medicine

## 2012-05-22 VITALS — BP 100/70 | HR 83 | Temp 97.8°F | Ht 64.25 in | Wt 160.4 lb

## 2012-05-22 DIAGNOSIS — Z0000 Encounter for general adult medical examination without abnormal findings: Secondary | ICD-10-CM

## 2012-05-22 NOTE — Assessment & Plan Note (Signed)
PE WNL.  UTD on GYN.  Check labs.  Anticipatory guidance provided.  

## 2012-05-22 NOTE — Progress Notes (Signed)
  Subjective:    Patient ID: Donna Higgins, female    DOB: 07-02-1980, 32 y.o.   MRN: [id number]  HPI New to establish.  Previous MD- Karma Ganja, [place].  GYN- Fredric Mare, [place].  No concerns, no recent CPE.  UTD on GYN.  + FHx of HTN, DMII.  No hyperlipidemia.   Review of Systems Patient reports no vision/ hearing changes, adenopathy,fever, weight change,  persistant/recurrent hoarseness , swallowing issues, chest pain, palpitations, edema, persistant/recurrent cough, hemoptysis, dyspnea (rest/exertional/paroxysmal nocturnal), gastrointestinal bleeding (melena, rectal bleeding), abdominal pain, significant heartburn, bowel changes, GU symptoms (dysuria, hematuria, incontinence), Gyn symptoms (abnormal  bleeding, pain),  syncope, focal weakness, memory loss, numbness & tingling, skin/hair/nail changes, abnormal bruising or bleeding, anxiety, or depression.     Objective:   Physical Exam General Appearance:    Alert, cooperative, no distress, appears stated age  Head:    Normocephalic, without obvious abnormality, atraumatic  Eyes:    PERRL, conjunctiva/corneas clear, EOM's intact, fundi    benign, both eyes  Ears:    Normal TM's and external ear canals, both ears  Nose:   Nares normal, septum midline, mucosa normal, no drainage    or sinus tenderness  Throat:   Lips, mucosa, and tongue normal; teeth and gums normal  Neck:   Supple, symmetrical, trachea midline, no adenopathy;    Thyroid: no enlargement/tenderness/nodules  Back:     Symmetric, no curvature, ROM normal, no CVA tenderness  Lungs:     Clear to auscultation bilaterally, respirations unlabored  Chest Wall:    No tenderness or deformity   Heart:    Regular rate and rhythm, S1 and S2 normal, no murmur, rub   or gallop  Breast Exam:    Deferred to GYN  Abdomen:     Soft, non-tender, bowel sounds active all four quadrants,    no masses, no organomegaly  Genitalia:    Deferred to GYN  Rectal:    Extremities:    Extremities normal, atraumatic, no cyanosis or edema  Pulses:   2+ and symmetric all extremities  Skin:   Skin color, texture, turgor normal, no rashes or lesions  Lymph nodes:   Cervical, supraclavicular, and axillary nodes normal  Neurologic:   CNII-XII intact, normal strength, sensation and reflexes    throughout          Assessment & Plan:

## 2012-05-22 NOTE — Patient Instructions (Addendum)
Follow up in 1 year or as needed Keep up the good work!  You look great! We'll notify you of your lab results and make any changes if needed Call with any questions or concerns Think of [place] as your home base! Welcome!  We're glad to have you!!

## 2012-05-23 LAB — CBC WITH DIFFERENTIAL/PLATELET
Basophils Absolute: 0 10*3/uL (ref 0.0–0.1)
Basophils Relative: 0.3 % (ref 0.0–3.0)
Eosinophils Absolute: 0.3 10*3/uL (ref 0.0–0.7)
HCT: 39.5 % (ref 36.0–46.0)
Hemoglobin: 13.4 g/dL (ref 12.0–15.0)
Lymphs Abs: 2.4 10*3/uL (ref 0.7–4.0)
MCHC: 33.8 g/dL (ref 30.0–36.0)
MCV: 88.2 fl (ref 78.0–100.0)
Neutro Abs: 4.4 10*3/uL (ref 1.4–7.7)
RBC: 4.48 Mil/uL (ref 3.87–5.11)
RDW: 12.8 % (ref 11.5–14.6)

## 2012-05-23 LAB — HEPATIC FUNCTION PANEL
ALT: 16 U/L (ref 0–35)
Bilirubin, Direct: 0 mg/dL (ref 0.0–0.3)
Total Bilirubin: 0.5 mg/dL (ref 0.3–1.2)
Total Protein: 7.2 g/dL (ref 6.0–8.3)

## 2012-05-23 LAB — BASIC METABOLIC PANEL
BUN: 13 mg/dL (ref 6–23)
Calcium: 9.3 mg/dL (ref 8.4–10.5)
Creatinine, Ser: 0.6 mg/dL (ref 0.4–1.2)

## 2012-05-23 LAB — LIPID PANEL
Cholesterol: 176 mg/dL (ref 0–200)
LDL Cholesterol: 110 mg/dL — ABNORMAL HIGH (ref 0–99)
Triglycerides: 69 mg/dL (ref 0.0–149.0)

## 2012-05-23 LAB — TSH: TSH: 0.83 u[IU]/mL (ref 0.35–5.50)

## 2012-05-25 LAB — VITAMIN D 1,25 DIHYDROXY
Vitamin D2 1, 25 (OH)2: <8 pg/mL
Vitamin D3 1, 25 (OH)2: 62 pg/mL

## 2012-05-30 ENCOUNTER — Encounter: Payer: Self-pay | Admitting: *Deleted

## 2013-04-19 ENCOUNTER — Ambulatory Visit (INDEPENDENT_AMBULATORY_CARE_PROVIDER_SITE_OTHER): Payer: BC Managed Care – PPO | Admitting: Internal Medicine

## 2013-04-19 ENCOUNTER — Encounter: Payer: Self-pay | Admitting: Internal Medicine

## 2013-04-19 VITALS — BP 100/61 | HR 91 | Temp 98.3°F | Wt 169.0 lb

## 2013-04-19 DIAGNOSIS — H9209 Otalgia, unspecified ear: Secondary | ICD-10-CM

## 2013-04-19 DIAGNOSIS — J029 Acute pharyngitis, unspecified: Secondary | ICD-10-CM

## 2013-04-19 DIAGNOSIS — H9201 Otalgia, right ear: Secondary | ICD-10-CM

## 2013-04-19 NOTE — Progress Notes (Signed)
Pre visit review using our clinic review tool, if applicable. No additional management support is needed unless otherwise documented below in the visit note. 

## 2013-04-19 NOTE — Progress Notes (Signed)
   Subjective:    Patient ID: Donna Higgins, female    DOB: 1981/03/14, 33 y.o.   MRN: [id number]  HPI   Symptoms began 04/18/13 has pain in the right ear and a sore throat. She has had some clear sputum with some mucoid collections evident.  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  She did take the flu shot.    Review of Systems She specifically denies frontal headache, facial pain, nasal purulence, dental pain, otic discharge, shortness of breath, or wheezing. She's had no fever, chills, or sweats.  She denies significant arthralgias or myalgias with the present illness.     Objective:   Physical Exam General appearance:good health ;well nourished; no acute distress or increased work of breathing is present.  No  Significant lymphadenopathy about the head, neck, or axilla noted.  Tiny node L neck  Eyes: No conjunctival inflammation or lid edema is present. .  Ears:  External ear exam shows no significant lesions or deformities.  Otoscopic examination reveals clear canals, tympanic membranes are intact bilaterally without bulging, retraction, inflammation or discharge.  Nose:  External nasal examination shows no deformity or inflammation. Nasal mucosa are pink and moist without lesions or exudates. No septal dislocation or deviation.No obstruction to airflow.   Oral exam: Dental hygiene is good; lips and gums are healthy appearing.There is no oropharyngeal erythema or exudate noted.   Neck:  No deformities, masses, or tenderness noted.      Heart:  Normal rate and regular rhythm. S1 and S2 normal without gallop, murmur, click, rub or other extra sounds.   Lungs:Chest clear to auscultation; no wheezes, rhonchi,rales ,or rubs present.No increased work of breathing.    Extremities:  No cyanosis, edema, or clubbing  noted    Skin: Warm & dry .         Assessment & Plan:  #1sore throat #2 otic pain See orders

## 2013-04-19 NOTE — Patient Instructions (Signed)
Zicam Melts or Zinc lozenges ; vitamin C 2000 mg daily; & Echinacea for 4-7 days. Report fever, exudate("pus") or progressive pain. Plain Mucinex (NOT D) for thick secretions ;force NON dairy fluids .   Nasal cleansing in the shower as discussed with lather of mild shampoo.After 10 seconds wash off lather while  exhaling through nostrils. Make sure that all residual soap is removed to prevent irritation.  Nasacort AQ OTC 1 spray in each nostril twice a day as needed. Use the "crossover" technique into opposite nostril spraying toward opposite ear @ 45 degree angle, not straight up into nostril.  Use a Neti pot daily only  as needed for significant sinus congestion; going from open side to congested side . Plain Allegra (NOT D )  160 daily , Loratidine 10 mg , OR Zyrtec 10 mg @ bedtime  as needed for itchy eyes & sneezing.

## 2014-01-18 ENCOUNTER — Ambulatory Visit (INDEPENDENT_AMBULATORY_CARE_PROVIDER_SITE_OTHER): Payer: BC Managed Care – PPO | Admitting: Physician Assistant

## 2014-01-18 VITALS — BP 102/81 | HR 100 | Temp 98.7°F | Resp 18 | Wt 175.0 lb

## 2014-01-18 DIAGNOSIS — B9789 Other viral agents as the cause of diseases classified elsewhere: Secondary | ICD-10-CM

## 2014-01-18 DIAGNOSIS — J069 Acute upper respiratory infection, unspecified: Secondary | ICD-10-CM

## 2014-01-18 DIAGNOSIS — J029 Acute pharyngitis, unspecified: Secondary | ICD-10-CM

## 2014-01-18 LAB — POCT RAPID STREP A (OFFICE): Rapid Strep A Screen: NEGATIVE

## 2014-01-18 MED ORDER — HYDROCOD POLST-CHLORPHEN POLST 10-8 MG/5ML PO LQCR: 5.0000 mL | Freq: Two times a day (BID) | ORAL | Status: DC | PRN

## 2014-01-18 MED ORDER — GUAIFENESIN ER 1200 MG PO TB12: 1.0000 | ORAL_TABLET | Freq: Two times a day (BID) | ORAL | Status: DC | PRN

## 2014-01-18 NOTE — Progress Notes (Signed)
Subjective:    Patient ID: KATNISS WEEDMAN, female    DOB: 09-04-1980, 33 y.o.   MRN: [id number]  Sore Throat  Associated symptoms include coughing.  Cough    Annye Asa, MD  Chief Complaint  Patient presents with  . Sore Throat  . Cough   No diagnosis found.  Prior to Admission medications   Not on File   Medications, allergies, past medical history, surgical history, family history, social history and problem list reviewed and updated.  62 yof with no significant PMH presents today with throat pain and bilateral ear pain that began yesterday afternoon. 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 She also developed a cough yesterday that became productive this morning with clear white sputum. No hemoptysis. She has had a little bit of SOB and chest tightness today since the cough has began. She feels as if the bilateral ear pain is coming from inside her ears. She denies any rhinorrhea, itchy watery eyes, sneezing, abdominal pain, headache, N/V, or diarrhea.   She took a decongestant last night and this morning which she doesn't think helped much. She has not taken anything for the throat pain, ear pain, or cough.    Review of Systems  Respiratory: Positive for cough.   Psychiatric/Behavioral: Positive for sleep disturbance (Secondary to cough).   No fever, chills. See HPI.     Objective:   Physical Exam  Constitutional: Vital signs are normal. She appears well-developed and well-nourished.  BP 102/81  Pulse 100  Temp(Src) 98.7 F (37.1 C) (Oral)  Resp 18  Wt 175 lb (79.379 kg)  SpO2 99%  LMP 01/04/2014   HENT:  Head: Normocephalic.  Right Ear: External ear and ear canal normal. Tympanic membrane is not erythematous and not bulging. No middle ear effusion.  Left Ear: Tympanic membrane, external ear and ear canal normal. Tympanic membrane is not  erythematous and not bulging.  No middle ear effusion.  Nose: Mucosal edema present. No rhinorrhea. Right sinus exhibits no maxillary sinus tenderness and no frontal sinus tenderness. Left sinus exhibits no maxillary sinus tenderness and no frontal sinus tenderness.  Mouth/Throat: Uvula is midline and oropharynx is clear and moist. No oropharyngeal exudate or posterior oropharyngeal erythema.  Eyes: Conjunctivae are normal. Right conjunctiva is not injected. Right conjunctiva has no hemorrhage. Left conjunctiva is not injected. Left conjunctiva has no hemorrhage.  Cardiovascular: Regular rhythm and normal heart sounds.  Tachycardia present.  Exam reveals no gallop.   No murmur heard. Tachycardic to 110.   Pulmonary/Chest: Effort normal and breath sounds normal. No respiratory distress. She has no decreased breath sounds. She has no wheezes. She has no rhonchi. She has no rales.  Lymphadenopathy:       Head (right side): No submental, no submandibular, no tonsillar and no preauricular adenopathy present.       Head (left side): No submental, no submandibular, no tonsillar and no preauricular adenopathy present.  Psychiatric: She has a normal mood and affect. Her speech is normal and behavior is normal.    Results for orders placed in visit on 01/18/14  POCT RAPID STREP A (OFFICE)      Result Value Ref Range   Rapid Strep A Screen Negative  Negative       Assessment & Plan:   Sore throat - Plan: POCT rapid strep A, Culture, Group A Strep  -- Strep  throat swab negative. -- Culture not sent as patient's physical exam very unconcerning for strep throat.  -- Recommended ibuprofen as needed for pain.   Viral URI with cough - Plan: chlorpheniramine-HYDROcodone (TUSSIONEX PENNKINETIC ER) 10-8 MG/5ML LQCR, Guaifenesin (MUCINEX MAXIMUM STRENGTH) 1200 MG TB12  -- Symptoms most likely viral with clear tympanic membranes bilaterally and clear lungs.  -- Tussionex given for cough. -- Mucinex to  help thin secretions with cough. -- Recommended fluids and rest. -- Patient instructions given.   Julieta Gutting, PA-C Physician Assistant-Certified [facility]  01/18/2014 6:24 PM

## 2014-01-18 NOTE — Patient Instructions (Addendum)
Your strep throat swab was negative in the clinic.  Please take the tussionex as prescribed twice daily for the cough and to help you sleep.  Please take the mucinex as prescribed.  Please return to clinic if symptoms worsen or don't improve.  Try to get plenty of rest and be sure to drink plenty of fluids to stay hydrated. Ibuprofen 200-400 mg every six hours as needed for pain.

## 2014-01-18 NOTE — Progress Notes (Signed)
I was directly involved with the patient's care and agree with the physical, diagnosis and treatment plan.  

## 2014-01-21 LAB — CULTURE, GROUP A STREP: ORGANISM ID, BACTERIA: NORMAL

## 2015-07-17 ENCOUNTER — Ambulatory Visit (INDEPENDENT_AMBULATORY_CARE_PROVIDER_SITE_OTHER): Payer: BC Managed Care – PPO | Admitting: Family Medicine

## 2015-07-17 ENCOUNTER — Encounter: Payer: Self-pay | Admitting: Family Medicine

## 2015-07-17 VITALS — BP 112/82 | HR 104 | Temp 99.0°F | Resp 16 | Wt 161.2 lb

## 2015-07-17 DIAGNOSIS — J302 Other seasonal allergic rhinitis: Secondary | ICD-10-CM

## 2015-07-17 DIAGNOSIS — J029 Acute pharyngitis, unspecified: Secondary | ICD-10-CM | POA: Diagnosis not present

## 2015-07-17 DIAGNOSIS — J309 Allergic rhinitis, unspecified: Secondary | ICD-10-CM | POA: Insufficient documentation

## 2015-07-17 LAB — POCT RAPID STREP A (OFFICE): Rapid Strep A Screen: NEGATIVE

## 2015-07-17 NOTE — Patient Instructions (Signed)
Follow up as needed Start daily Claritin or Zyrtec for the allergy/congestion Drink plenty of fluids Ibuprofen for sore throat REST!!!! If symptoms change or worsen over the next few days- let me know and we can call in a medication Call with any questions or concerns Hang in there!!!

## 2015-07-17 NOTE — Progress Notes (Signed)
   Subjective:    Patient ID: Donna Higgins, female    DOB: 02-10-1981, 35 y.o.   MRN: [id number]  HPI Sore throat- sxs started last night, painful to swallow.  Some sinus tenderness.  + cough- intermittently productive.  Denies nasal congestion.  + sneezing.  L ear fullness.  + exposure to flu and strep.  Denies CP, SOB, fevers, N/V.   No hx of seasonal allergies.   Review of Systems For ROS see HPI     Objective:   Physical Exam  Constitutional: She is oriented to person, place, and time. She appears well-developed and well-nourished. No distress.  HENT:  Head: Normocephalic and atraumatic.  Right Ear: Tympanic membrane normal.  Left Ear: Tympanic membrane normal.  Nose: Mucosal edema and rhinorrhea present. Right sinus exhibits no maxillary sinus tenderness and no frontal sinus tenderness. Left sinus exhibits no maxillary sinus tenderness and no frontal sinus tenderness.  Mouth/Throat: Mucous membranes are normal. Posterior oropharyngeal erythema (w/ PND) present.  Eyes: Conjunctivae and EOM are normal. Pupils are equal, round, and reactive to light.  Neck: Normal range of motion. Neck supple.  Cardiovascular: Normal rate, regular rhythm and normal heart sounds.   Pulmonary/Chest: Effort normal and breath sounds normal. No respiratory distress. She has no wheezes. She has no rales.  Lymphadenopathy:    She has no cervical adenopathy.  Neurological: She is alert and oriented to person, place, and time.  Skin: Skin is warm and dry.  Psychiatric: She has a normal mood and affect. Her behavior is normal. Thought content normal.  Vitals reviewed.         Assessment & Plan:

## 2015-07-17 NOTE — Progress Notes (Signed)
Pre visit review using our clinic review tool, if applicable. No additional management support is needed unless otherwise documented below in the visit note. 

## 2015-07-17 NOTE — Assessment & Plan Note (Signed)
New.  No evidence of bacterial infxn on PE that would require abx.  Suspect that this is a viral/allergy combo.  Start daily antihistamine.  Ibuprofen for sore throat.  Strep (-) in office.  Reviewed supportive care and red flags that should prompt return.  Pt expressed understanding and is in agreement w/ plan.

## 2015-07-21 LAB — HM PAP SMEAR

## 2016-04-07 ENCOUNTER — Ambulatory Visit (INDEPENDENT_AMBULATORY_CARE_PROVIDER_SITE_OTHER): Payer: BC Managed Care – PPO | Admitting: Family Medicine

## 2016-04-07 ENCOUNTER — Encounter: Payer: Self-pay | Admitting: Family Medicine

## 2016-04-07 VITALS — BP 108/68 | HR 69 | Temp 97.8°F | Resp 16 | Ht 64.0 in | Wt 165.0 lb

## 2016-04-07 DIAGNOSIS — L259 Unspecified contact dermatitis, unspecified cause: Secondary | ICD-10-CM

## 2016-04-07 MED ORDER — TRIAMCINOLONE ACETONIDE 0.1 % EX OINT: 1.0000 "application " | TOPICAL_OINTMENT | Freq: Two times a day (BID) | CUTANEOUS | 1 refills | Status: AC

## 2016-04-07 MED ORDER — PREDNISONE 10 MG PO TABS: ORAL_TABLET | ORAL | 0 refills | Status: DC

## 2016-04-07 NOTE — Progress Notes (Signed)
Pre visit review using our clinic review tool, if applicable. No additional management support is needed unless otherwise documented below in the visit note. 

## 2016-04-07 NOTE — Patient Instructions (Signed)
Follow up as needed/scheduled Start the Prednisone as directed- take w/ food Apply the cream twice daily to help w/ itch and healing  Call with any questions or concerns Hang in there! Happy New Year!

## 2016-04-07 NOTE — Progress Notes (Signed)
   Subjective:    Patient ID: Donna Higgins, female    DOB: [birth year], 35 y.o.   MRN: [id number]  HPI Rash- first noticed 3-4 days ago on L jaw line.  Now has much larger area on L side.  Area is very itchy.  Daughter has hx of ringworm.  Pt initially thought that's what she's dealing w/ and started tx w/o relief.  Some improvement w/ tea tree oil.  No changes in detergents/soaps/lotions.  No one at home w/ any similar lesions.   Review of Systems For ROS see HPI     Objective:   Physical Exam  Constitutional: She is oriented to person, place, and time. She appears well-developed and well-nourished. No distress.  HENT:  Head: Normocephalic and atraumatic.  Neurological: She is alert and oriented to person, place, and time.  Skin: Skin is warm and dry. Rash (linear maculopapular rash along L mandible, similar linear area along L flank w/ larger, confluent area on L side below linear area) noted. There is erythema.  Psychiatric: She has a normal mood and affect. Her behavior is normal. Thought content normal.  Vitals reviewed.         Assessment & Plan:  Contact dermatitis- new.  Pt's sxs, appearance of rash, and extensive itching are consistent w/ contact dermatitis.  Due to fact it is spreading will start oral steroids and topical steroid ointment.  Reviewed supportive care and red flags that should prompt return.  Pt expressed understanding and is in agreement w/ plan.

## 2016-04-16 ENCOUNTER — Ambulatory Visit (INDEPENDENT_AMBULATORY_CARE_PROVIDER_SITE_OTHER): Payer: BC Managed Care – PPO

## 2016-04-16 DIAGNOSIS — Z23 Encounter for immunization: Secondary | ICD-10-CM | POA: Diagnosis not present

## 2017-06-13 ENCOUNTER — Ambulatory Visit: Payer: BC Managed Care – PPO | Admitting: Family Medicine

## 2017-06-13 ENCOUNTER — Other Ambulatory Visit: Payer: Self-pay

## 2017-06-13 ENCOUNTER — Encounter: Payer: Self-pay | Admitting: Family Medicine

## 2017-06-13 VITALS — BP 110/78 | HR 77 | Temp 99.9°F | Resp 18 | Ht 64.0 in | Wt 163.0 lb

## 2017-06-13 DIAGNOSIS — J029 Acute pharyngitis, unspecified: Secondary | ICD-10-CM | POA: Diagnosis not present

## 2017-06-13 LAB — POCT RAPID STREP A (OFFICE): Rapid Strep A Screen: POSITIVE — AB

## 2017-06-13 MED ORDER — AMOXICILLIN 875 MG PO TABS: 875.0000 mg | ORAL_TABLET | Freq: Two times a day (BID) | ORAL | 0 refills | Status: DC

## 2017-06-13 NOTE — Patient Instructions (Signed)
Follow up as needed or as scheduled Start the Amoxicillin twice daily- w/ food- for the strep throat Drink plenty of fluids REST!!! Alternate tylenol and ibuprofen for pain relief Call with any questions or concerns Hang in there!!!

## 2017-06-13 NOTE — Progress Notes (Signed)
   Subjective:    Patient ID: Donna Higgins, female    DOB: [birth year], 37 y.o.   MRN: [id number]  HPI Sore throat- pt's sxs started on Saturday when daughter tested + for strep.  + low grade fevers.  Bilateral ear pain.  Painful to swallow.  Mild HA.  No N/V.   Review of Systems For ROS see HPI     Objective:   Physical Exam  Constitutional: She is oriented to person, place, and time. She appears well-developed and well-nourished. No distress.  HENT:  Head: Normocephalic and atraumatic.  Nose: Nose normal.  TMs normal bilaterally No TTP over sinuses Bilateral tonsillar enlargement w/ erythema and exudate  Neck: Normal range of motion. Neck supple.  Cardiovascular: Normal rate, regular rhythm and normal heart sounds.  Pulmonary/Chest: Effort normal and breath sounds normal. No respiratory distress. She has no wheezes. She has no rales.  Lymphadenopathy:    She has cervical adenopathy.  Neurological: She is alert and oriented to person, place, and time.  Vitals reviewed.         Assessment & Plan:  Strep- pt's sxs and PE consistent w/ strep.  Rapid test confirms.  Start abx.  Reviewed supportive care and red flags that should prompt return.  Pt expressed understanding and is in agreement w/ plan.

## 2018-08-18 ENCOUNTER — Ambulatory Visit (INDEPENDENT_AMBULATORY_CARE_PROVIDER_SITE_OTHER): Payer: BC Managed Care – PPO | Admitting: Physician Assistant

## 2018-08-18 ENCOUNTER — Other Ambulatory Visit: Payer: Self-pay

## 2018-08-18 ENCOUNTER — Encounter: Payer: Self-pay | Admitting: Physician Assistant

## 2018-08-18 VITALS — BP 92/60 | HR 83 | Temp 97.9°F | Resp 14 | Ht 64.0 in | Wt 157.0 lb

## 2018-08-18 DIAGNOSIS — L255 Unspecified contact dermatitis due to plants, except food: Secondary | ICD-10-CM

## 2018-08-18 MED ORDER — METHYLPREDNISOLONE ACETATE 80 MG/ML IJ SUSP
80.0000 mg | Freq: Once | INTRAMUSCULAR | Status: AC
  Administered 2018-08-18: 11:00:00 80 mg via INTRAMUSCULAR

## 2018-08-18 MED ORDER — TRIAMCINOLONE ACETONIDE 0.1 % EX CREA: 1.0000 "application " | TOPICAL_CREAM | Freq: Two times a day (BID) | CUTANEOUS | 0 refills | Status: DC

## 2018-08-18 MED ORDER — PREDNISONE 10 MG PO TABS: ORAL_TABLET | ORAL | 0 refills | Status: AC

## 2018-08-18 NOTE — Patient Instructions (Signed)
Please keep hydrated and rest. The steroid shot given today should calm things down. Start the steroid cream twice daily to the areas avoiding application in the groin area, armpits or the eyes.  You can use topical Witch hazel to help dry up the areas and help with itch.  A daily Claritin or other non-drowsy antihistamine like Xyzal can help with itch.  If not significantly improving over weekend or if anything is worsening, fill the script for the Prednisone and take as directed.   Call the office if things are not improving.

## 2018-08-18 NOTE — Progress Notes (Signed)
Patient presents to clinic today c/o 2 days of pruritic, spreading papulovesicular rash starting on left cheek, now moved to bilateral cheeks, forehead, neck R-side torso and upper extremities bilaterally. Denies rash of groin or lower legs. Notes symptoms started after working in the yard for 2 days. Knows there was likely some poison oak/ivy in the area where they were working. Has history of significant allergy to poison ivy.   Past Medical History:  Diagnosis Date  . Decreased libido   . History of chicken pox   . Internal hemorrhoids without mention of complication     No current outpatient medications on file prior to visit.   No current facility-administered medications on file prior to visit.     Allergies  Allergen Reactions  . Stadol [Butorphanol Tartrate]     Legs twitching, nausea    Family History  Problem Relation Age of Onset  . Urolithiasis Mother   . Hypertension Mother   . Hyperlipidemia Mother   . Cancer Maternal Aunt        breast  . Diabetes Maternal Grandmother     Social History   Socioeconomic History  . Marital status: Married    Spouse name: Not on file  . Number of children: Not on file  . Years of education: Not on file  . Highest education level: Not on file  Occupational History  . Not on file  Social Needs  . Financial resource strain: Not on file  . Food insecurity:    Worry: Not on file    Inability: Not on file  . Transportation needs:    Medical: Not on file    Non-medical: Not on file  Tobacco Use  . Smoking status: Never Smoker  . Smokeless tobacco: Never Used  Substance and Sexual Activity  . Alcohol use: Yes  . Drug use: No  . Sexual activity: Yes    Comment: Husband vasectomy  Lifestyle  . Physical activity:    Days per week: Not on file    Minutes per session: Not on file  . Stress: Not on file  Relationships  . Social connections:    Talks on phone: Not on file    Gets together: Not on file    Attends  religious service: Not on file    Active member of club or organization: Not on file    Attends meetings of clubs or organizations: Not on file    Relationship status: Not on file  Other Topics Concern  . Not on file  Social History Narrative  . Not on file   Review of Systems - See HPI.  All other ROS are negative.  BP 92/60   Pulse 83   Temp 97.9 F (36.6 C) (Oral)   Resp 14   Ht 5\' 4"  (1.626 m)   Wt 157 lb (71.2 kg)   SpO2 99%   BMI 26.95 kg/m   Physical Exam Vitals signs reviewed.  Constitutional:      Appearance: Normal appearance.  HENT:     Head: Normocephalic and atraumatic.     Mouth/Throat:     Mouth: Mucous membranes are moist.  Eyes:     Conjunctiva/sclera: Conjunctivae normal.  Cardiovascular:     Rate and Rhythm: Normal rate and regular rhythm.     Pulses: Normal pulses.     Heart sounds: Normal heart sounds.  Pulmonary:     Effort: Pulmonary effort is normal.     Breath sounds: Normal breath sounds.  Skin:    Comments: Papulovesicular rash noted over body including cheeks bilaterally, forehead bilaterally, R-sided neck and torse and forearm bilaterally. On arms and neck there is linear distribution. The other areas are more confluent.   Neurological:     General: No focal deficit present.     Mental Status: She is alert and oriented to person, place, and time.     Assessment/Plan: 1. Rhus dermatitis IM Depomedrol given in office. Rx Triamcinolone to apply BID to appropriate areas. Steroid taper printed and given to start taking if symptoms are not easing up in the next few days. Trying to avoid prolonged systemic steroids due to current COVID pandemic and risk of immunosuppression. Follow-up prn if symptoms are not resolving.   - triamcinolone cream (KENALOG) 0.1 %; Apply 1 application topically 2 (two) times daily.  Dispense: 30 g; Refill: 0 - predniSONE (DELTASONE) 10 MG tablet; Take 4 tablets (40 mg total) by mouth daily with breakfast for 2 days,  THEN 3 tablets (30 mg total) daily with breakfast for 2 days, THEN 2 tablets (20 mg total) daily with breakfast for 2 days, THEN 1 tablet (10 mg total) daily with breakfast for 2 days.  Dispense: 20 tablet; Refill: 0 - methylPREDNISolone acetate (DEPO-MEDROL) injection 80 mg   Leeanne Rio, [place]

## 2020-08-05 ENCOUNTER — Other Ambulatory Visit: Payer: Self-pay

## 2020-08-05 ENCOUNTER — Ambulatory Visit: Payer: BC Managed Care – PPO | Admitting: Nurse Practitioner

## 2020-08-05 VITALS — BP 95/67 | HR 79 | Temp 98.7°F | Ht 64.0 in | Wt 161.5 lb

## 2020-08-05 DIAGNOSIS — H469 Unspecified optic neuritis: Secondary | ICD-10-CM | POA: Diagnosis not present

## 2020-08-05 DIAGNOSIS — Z87891 Personal history of nicotine dependence: Secondary | ICD-10-CM

## 2020-08-05 DIAGNOSIS — G35 Multiple sclerosis: Secondary | ICD-10-CM | POA: Diagnosis not present

## 2020-08-05 DIAGNOSIS — Z7689 Persons encountering health services in other specified circumstances: Secondary | ICD-10-CM | POA: Diagnosis not present

## 2020-08-05 DIAGNOSIS — R590 Localized enlarged lymph nodes: Secondary | ICD-10-CM

## 2020-08-05 NOTE — Progress Notes (Signed)
New Patient Office Visit  Subjective:  Patient ID: Donna Higgins, female    DOB: 1980-11-25  Age: 40 y.o. MRN: [id number]  CC:  Chief Complaint  Patient presents with  . New Patient (Initial Visit)    HPI [name] presents to establish new primary care provider. A little more than a week ago, the patient had been having some pain in right eye with mildly blurry vision.  Initial symptoms started on Friday 07/25/2020. On Saturday, the vision was becoming more blurry. On Sunday, she states her vision was nearly completely black in right eye. She was able to get appointment with ophthalmologist in Monday 418/2022. She states that after a few tests, she was told she had MS and was directed to go to ER to have IV steroids started and further testing done.  MRI of the brain w/o contrast IMPRESSION:  There are several foci of high signal in the white matter bilaterally. Most notable foci seen in the right frontal lobe and in the right temporoparietal region. These may relate to microvascular ischemic change. Demyelinating process not excluded.  No other significant finding.  MRI of the orbits IMPRESSION:  1. Right optic neuritis.  2. Review of the corresponding MRI brain exam demonstrates several of the periventricular FLAIR hyperintensities demonstrating a perpendicular orientation relative lateral ventricles, with an least one of the FLAIR hyperintensities identified in the posterior left temporal lobe. These findings are suggestive of demyelinating disease.  3. Negative for soft tissue mass.  4. Chronic right sphenoid sinus disease.  MRI cervical spine IMPRESSION:  1. A T2 hyperintense lesion along the right aspect of the cord at the lower C2 level measuring up to 6.7 mm in size. Possible additional small lesion along the right posterior aspect of the cord at the C5 level. No definite abnormal cord enhancement given the limitation of motion. Given the findings MRI brain/orbital  imaging these lesions are concerning for demyelinating disease. Correlate clinically, recommend continued follow-up.  MRI of thoracic spine  IMPRESSION:  1. A T2 hyperintense lesion along the ventral aspect of the cord at the T11 level, no definite abnormal enhancement.Given the findings MRI brain/orbital imaging this lesion is concerning for demyelinating disease, other nonspecific lesions not completely excluded. Correlate clinically, recommend continued follow-up.  2. Concern for adenopathy or mass in the subcarinal mediastinum and at the left hilar region, possible opacity/lesion or artifact within the left lung field. Recommend aCT chest for further evaluation, preferably with contrast unless there are contraindications  During her hospitalization, the patient did receive several rounds of IV steroids. She was discharged home on oral steroids for next 11 days. She continues to take this daily. She has a follow up appointment with ophthalmologist 08/29/2020. She was not referred to neurology. Was advised to follow up with her primary care provider. It was also recommended that she have a chest CT with IV contrast due to the suspected mediastinal/hilar adenopathy seen on MRI of the thoracic spine. She does have a history of intermittent smoking for past several years. This study was not scheduled prior to her discharge.   She states that vision is getting much better, however, it is not back to baseline. Central vision is still blurry. Having a difficult time seeing at night. Able to drive during the day, but not driving at night. The patient states that she has not had other neurological symptoms. She denies abnormal headaches. She has not had weakness or fasciculations of the extremities. She denies muscle  pain or weakness in the extremities. She denies chest pain, chest pressure, or shortness breath. She denies cough or wheezing. She has essentially returned to normal activities since discharge from  the hospital.   Past Medical History:  Diagnosis Date  . Decreased libido   . History of chicken pox   . Internal hemorrhoids without mention of complication   . Neuromuscular disorder [facility])    [employer]rees[employer] 08/05/2020    Past Surgical History:  Procedure Laterality Date  . CESAREAN SECTION  2009  . WISDOM TOOTH EXTRACTION      Family History  Problem Relation Age of Onset  . Urolithiasis Mother   . Hypertension Mother   . Hyperlipidemia Mother   . Cancer Maternal Aunt        breast  . Diabetes Maternal Grandmother     Social History   Socioeconomic History  . Marital status: Married    Spouse name: Not on file  . Number of children: Not on file  . Years of education: Not on file  . Highest education level: Not on file  Occupational History  . Not on file  Tobacco Use  . Smoking status: Current Some Day Smoker  . Smokeless tobacco: Never Used  Substance and Sexual Activity  . Alcohol use: Yes  . Drug use: No  . Sexual activity: Yes    Comment: Husband vasectomy  Other Topics Concern  . Not on file  Social History Narrative  . Not on file   Social Determinants of Health   Financial Resource Strain: Not on file  Food Insecurity: Not on file  Transportation Needs: Not on file  Physical Activity: Not on file  Stress: Not on file  Social Connections: Not on file  Intimate Partner Violence: Not on file    ROS Review of Systems  Constitutional: Negative for chills, fatigue and fever.  HENT: Negative for congestion, postnasal drip, rhinorrhea, sinus pressure and sinus pain.   Eyes: Positive for visual disturbance.       Recent diagnosis of right optic neuritis   Respiratory: Negative for cough, chest tightness, shortness of breath and wheezing.   Cardiovascular: Negative for chest pain and palpitations.  Gastrointestinal: Negative for constipation, diarrhea, nausea and vomiting.  Endocrine: Negative.   Genitourinary: Negative for dysuria, frequency and  urgency.  Musculoskeletal: Negative for arthralgias, back pain, gait problem and myalgias.  Skin: Negative for rash.  Allergic/Immunologic: Negative.   Neurological: Negative for dizziness, tremors, seizures, light-headedness and headaches.  Psychiatric/Behavioral: Negative.  The patient is not nervous/anxious.   All other systems reviewed and are negative.   Objective:   Today's Vitals   08/05/20 1556  BP: 95/67  Pulse: 79  Temp: 98.7 F (37.1 C)  SpO2: 100%  Weight: 161 lb 8 oz (73.3 kg)  Height: 5\' 4"  (1.626 m)   Body mass index is 27.72 kg/m.   Physical Exam Vitals and nursing note reviewed.  Constitutional:      Appearance: Normal appearance. She is well-developed.  HENT:     Head: Normocephalic and atraumatic.  Eyes:     Extraocular Movements: Extraocular movements intact.     Conjunctiva/sclera: Conjunctivae normal.     Pupils: Pupils are equal, round, and reactive to light.  Neck:     Comments: Mild cervical lymphadenopathy on left side of the neck. Cardiovascular:     Rate and Rhythm: Normal rate and regular rhythm.     Pulses: Normal pulses.     Heart sounds: Normal heart sounds.  Pulmonary:     Effort: Pulmonary effort is normal.     Breath sounds: Normal breath sounds.  Abdominal:     Palpations: Abdomen is soft.  Musculoskeletal:        General: Normal range of motion.     Cervical back: Normal range of motion and neck supple.  Lymphadenopathy:     Cervical: Cervical adenopathy present.  Skin:    General: Skin is warm and dry.     Capillary Refill: Capillary refill takes less than 2 seconds.  Neurological:     General: No focal deficit present.     Mental Status: She is alert and oriented to person, place, and time.     Cranial Nerves: No cranial nerve deficit.     Sensory: No sensory deficit.     Motor: No weakness.     Coordination: Coordination normal.     Gait: Gait normal.  Psychiatric:        Mood and Affect: Mood normal.         Behavior: Behavior normal.        Thought Content: Thought content normal.        Judgment: Judgment normal.     Assessment & Plan:  1. Encounter to establish care Appointment today to establish new primary care provider.   2. Multiple sclerosis ([place]) Reviewed MRIs done during hospitalization. This included the brain, orbits, thoracic, and cervical spine. Findings consistent with demyelinating disease. There was also mediastinal/hilar adenopathy suspected. Will get STAT CT chest with contrast as recommended on MRI report. Urgent referral to neurology for further evaluation and treatment of multiple sclerosis.  - CT Chest W Contrast; Future - Ambulatory referral to Neurology  3. 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 She should complete prescribed prednisone. Follow up with ophthalmologist as scheduled. Urgent referral to neurology for further evaluation and treatment.  - Ambulatory referral to Neurology  4. Hilar lymphadenopathy Suspected mediastinal/hilar lymphadenopathy present on MRI of thoracic spine. Will get recommended CT chest with contrast STAT for further evaluation. Referral as indicated.  - CT Chest W Contrast; Future  5. History of smoking less than 10 pack years With possible hilar adenopathy and history of intermittent smoking, will get CT scan of chest with contrast STAT for further evaluation.  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 cholecalciferol (VITAMIN D3) 25 MCG (1000 UNIT) tablet Take 1,000 Units by mouth daily.  . predniSONE (DELTASONE) 20 MG tablet Take by mouth.  . [DISCONTINUED] pseudoephedrine (SUDAFED) 30 MG tablet Take by mouth.  . [DISCONTINUED] triamcinolone cream (KENALOG) 0.1 % Apply 1 application topically 2 (two) times daily.   No facility-administered encounter medications on file as of 08/05/2020.    Follow-up: Return in  about 2 weeks (around 08/19/2020) for review ct. progress with neurology .   Ronnell Freshwater, NP

## 2020-08-05 NOTE — Patient Instructions (Signed)
Multiple Sclerosis Multiple sclerosis (MS) is a disease of the brain, spinal cord, and optic nerves (central nervous system). It causes the body's disease-fighting (immune) system to destroy the protective covering (myelin sheath) around nerves in the brain. When this happens, signals (nerve impulses) going to and from the brain and spinal cord do not get sent properly or may not get sent at all. There are several types of MS:  Relapsing-remitting MS. This is the most common type. This causes sudden attacks of symptoms. After an attack, you may recover completely until the next attack, or some symptoms may remain permanently.  Secondary progressive MS. This usually develops after the onset of relapsing-remitting MS. Similar to relapsing-remitting MS, this type also causes sudden attacks of symptoms. Attacks may be less frequent, but symptoms slowly get worse (progress) over time.  Primary progressive MS. This causes symptoms that steadily progress over time. This type of MS does not cause sudden attacks of symptoms. The age of onset of MS varies, but it often develops between 48-45 years of age. MS is a lifelong (chronic) condition. There is no cure, but treatment can help slow down the progression of the disease. What are the causes? The cause of this condition is not known. What increases the risk? You are more likely to develop this condition if:  You are a woman.  You have a relative with MS. However, the condition is not passed from parent to child (inherited).  You have a lack (deficiency) of vitamin D.  You smoke. MS is more common in the [place] than in the [place]. What are the signs or symptoms? Relapsing-remitting and secondary progressive MS cause symptoms to occur in episodes or attacks that may last weeks to months. There may be long periods between attacks in which there are almost no symptoms. Primary progressive MS causes symptoms to steadily  progress after they develop. Symptoms of MS vary because of the many different ways it affects the central nervous system. The main symptoms include:  Vision problems and eye pain.  Numbness and weakness.  Inability to move your arms, hands, feet, or legs (paralysis).  Balance problems.  Shaking that you cannot control (tremors).  Muscle spasms.  Problems with thinking (cognitive changes). MS can also cause symptoms that are associated with the disease, but are not always the direct result of an MS attack. They may include:  Inability to control urination or bowel movements (incontinence).  Headaches.  Fatigue.  Inability to tolerate heat.  Emotional changes.  Depression.  Pain. How is this diagnosed? This condition is diagnosed based on:  Your symptoms.  A neurological exam. This involves checking central nervous system function, such as nerve function, reflexes, and coordination.  MRIs of the brain and spinal cord.  Lab tests, including a lumbar puncture that tests the fluid that surrounds the brain and spinal cord (cerebrospinal fluid).  Tests to measure the electrical activity of the brain in response to stimulation (evoked potentials). How is this treated? There is no cure for MS, but medicines can help decrease the number and frequency of attacks and help relieve nuisance symptoms. Treatment options may include:  Medicines that reduce the frequency of attacks. These medicines may be given by injection, by mouth (orally), or through an IV.  Medicines that reduce inflammation (steroids). These may provide short-term relief of symptoms.  Medicines to help control pain, depression, fatigue, or incontinence.  Nutritional counseling. Vitamin D supplements, if you have a deficiency.  Using  devices to help you move around (assistive devices), such as braces, a cane, or a walker.  Physical therapy to strengthen and stretch your muscles.  Occupational therapy to  help you with everyday tasks.  Alternative or complementary treatments such as exercise, massage, or acupuncture.   Follow these instructions at home:  Take over-the-counter and prescription medicines only as told by your health care provider.  Do not drive or use heavy machinery while taking prescription pain medicine.  Use assistive devices as recommended by your physical therapist or your health care provider.  Exercise as directed by your health care provider.  Eating healthy can help manage MS symptoms.  Return to your normal activities as told by your health care provider. Ask your health care provider what activities are safe for you.  Reach out for support. Share your feelings with friends, family, or a support group.  Keep all follow-up visits as told by your health care provider and therapists. This is important. Where to find more information  National Multiple Sclerosis Society: https://www.nationalmssociety.[place] of Neurological Disorders and Stroke: http://hendricks-barton.net/  [employer]ative [employer]: GourmetRating.dk Contact a health care provider if:  You feel depressed.  You develop new pain or numbness.  You have tremors.  You have problems with sexual function. Get help right away if:  You develop paralysis.  You develop numbness.  You have problems with your bladder or bowel function.  You develop double vision.  You lose vision in one or both eyes.  You develop suicidal thoughts.  You develop severe confusion. If you ever feel like you may hurt yourself or others, or have thoughts about taking your own life, get help right away. You can go to your nearest emergency department or call:  Your local emergency services (911 in the [place]  A suicide crisis helpline, such as the [facility] at 765-065-2065. This is open 24 hours a day. Summary  Multiple  sclerosis (MS) is a disease of the central nervous system that causes the body's immune system to destroy the protective covering (myelin sheath) around nerves in the brain.  There are 3 types of MS: relapsing-remitting, secondary progressive, and primary progressive. Relapsing-remitting and secondary progressive MS cause symptoms to occur in episodes or attacks that may last weeks to months. Primary progressive MS causes symptoms to steadily progress after they develop.  There is no cure for MS, but medicines can help decrease the number and frequency of attacks and help relieve nuisance symptoms. Treatment may also include physical or occupational therapy.  If you develop numbness, paralysis, vision problems, or other neurological symptoms, get help right away. This information is not intended to replace advice given to you by your health care provider. Make sure you discuss any questions you have with your health care provider. Document Revised: 01/08/2020 Document Reviewed: 01/08/2020 Elsevier Patient Education  [place].  Lymphadenopathy  Lymphadenopathy means that your lymph glands are swollen or larger than normal. Lymph glands, also called lymph nodes, are collections of tissue that filter excess fluid, bacteria, viruses, and waste from your bloodstream. They are part of your body's disease-fighting system (immune system), which protects your body from germs. There may be different causes of lymphadenopathy, depending on where it is in your body. Some types go away on their own. Lymphadenopathy can occur anywhere that you have lymph glands, including these areas:  Neck (cervical lymphadenopathy).  Chest (mediastinal lymphadenopathy).  Lungs (hilar lymphadenopathy).  Underarms (axillary  lymphadenopathy).  Groin (inguinal lymphadenopathy). When your immune system responds to germs, infection-fighting cells and fluid build up in your lymph glands. This causes some swelling and  enlargement. If the lymph nodes do not go back to normal size after you have an infection or disease, your health care provider may do tests. These tests help to monitor your condition and find the reason why the glands are still swollen and enlarged. Follow these instructions at home:  Get plenty of rest.  Your health care provider may recommend over-the-counter medicines for pain. Take over-the-counter and prescription medicines only as told by your health care provider.  If directed, apply heat to swollen lymph glands as often as told by your health care provider. Use the heat source that your health care provider recommends, such as a moist heat pack or a heating pad. ? Place a towel between your skin and the heat source. ? Leave the heat on for 20-30 minutes. ? Remove the heat if your skin turns bright red. This is especially important if you are unable to feel pain, heat, or cold. You may have a greater risk of getting burned.  Check your affected lymph glands every day for changes. Check other lymph gland areas as told by your health care provider. Check for changes such as: ? More swelling. ? Sudden increase in size. ? Redness or pain. ? Hardness.  Keep all follow-up visits. This is important.   Contact a health care provider if you have:  Lymph glands that: ? Are still swollen after 2 weeks. ? Have suddenly gotten bigger or the swelling spreads. ? Are red, painful, or hard.  Fluid leaking from the skin near an enlarged lymph gland.  Problems with breathing.  A fever, chills, or night sweats.  Fatigue.  A sore throat.  Pain in your abdomen.  Weight loss. Get help right away if you have:  Severe pain.  Chest pain.  Shortness of breath. These symptoms may represent a serious problem that is an emergency. Do not wait to see if the symptoms will go away. Get medical help right away. Call your local emergency services (911 in the [place] Do not drive yourself to the  hospital. Summary  Lymphadenopathy means that your lymph glands are swollen or larger than normal.  Lymph glands, also called lymph nodes, are collections of tissue that filter excess fluid, bacteria, viruses, and waste from the bloodstream. They are part of your body's disease-fighting system (immune system).  Lymphadenopathy can occur anywhere that you have lymph glands.  If the lymph nodes do not go back to normal size after you have an infection or disease, your health care provider may do tests to monitor your condition and find the reason why the glands are still swollen and enlarged.  Check your affected lymph glands every day for changes. Check other lymph gland areas as told by your health care provider. This information is not intended to replace advice given to you by your health care provider. Make sure you discuss any questions you have with your health care provider. Document Revised: 01/23/2020 Document Reviewed: 01/23/2020 Elsevier Patient Education  2021 [employer].

## 2020-08-07 ENCOUNTER — Other Ambulatory Visit (INDEPENDENT_AMBULATORY_CARE_PROVIDER_SITE_OTHER): Payer: BC Managed Care – PPO

## 2020-08-07 ENCOUNTER — Telehealth: Payer: Self-pay | Admitting: Nurse Practitioner

## 2020-08-07 ENCOUNTER — Other Ambulatory Visit: Payer: Self-pay

## 2020-08-07 ENCOUNTER — Ambulatory Visit: Payer: BC Managed Care – PPO | Admitting: Neurology

## 2020-08-07 ENCOUNTER — Encounter: Payer: Self-pay | Admitting: Neurology

## 2020-08-07 VITALS — BP 116/78 | HR 83 | Ht 64.0 in | Wt 158.2 lb

## 2020-08-07 DIAGNOSIS — H469 Unspecified optic neuritis: Secondary | ICD-10-CM

## 2020-08-07 DIAGNOSIS — G35 Multiple sclerosis: Secondary | ICD-10-CM

## 2020-08-07 DIAGNOSIS — G35D Multiple sclerosis, unspecified: Secondary | ICD-10-CM

## 2020-08-07 LAB — VITAMIN D 25 HYDROXY (VIT D DEFICIENCY, FRACTURES): VITD: 29.1 ng/mL — ABNORMAL LOW (ref 30.00–100.00)

## 2020-08-07 NOTE — Patient Instructions (Signed)
Plan is to start either Tysabri or Ocrevus: 1.  Check JC Virus antibody and index; check quantitative immunoglobulin panel; check vitamin D level 2.  Will contact you with results and plan   Multiple Sclerosis Multiple sclerosis (MS) is a disease of the brain, spinal cord, and optic nerves (central nervous system). It causes the body's disease-fighting (immune) system to destroy the protective covering (myelin sheath) around nerves in the brain. When this happens, signals (nerve impulses) going to and from the brain and spinal cord do not get sent properly or may not get sent at all. There are several types of MS:  Relapsing-remitting MS. This is the most common type. This causes sudden attacks of symptoms. After an attack, you may recover completely until the next attack, or some symptoms may remain permanently.  Secondary progressive MS. This usually develops after the onset of relapsing-remitting MS. Similar to relapsing-remitting MS, this type also causes sudden attacks of symptoms. Attacks may be less frequent, but symptoms slowly get worse (progress) over time.  Primary progressive MS. This causes symptoms that steadily progress over time. This type of MS does not cause sudden attacks of symptoms. The age of onset of MS varies, but it often develops between 5-54 years of age. MS is a lifelong (chronic) condition. There is no cure, but treatment can help slow down the progression of the disease. What are the causes? The cause of this condition is not known. What increases the risk? You are more likely to develop this condition if:  You are a woman.  You have a relative with MS. However, the condition is not passed from parent to child (inherited).  You have a lack (deficiency) of vitamin D.  You smoke. MS is more common in the [place] than in the [place]. What are the signs or symptoms? Relapsing-remitting and secondary progressive MS cause symptoms to  occur in episodes or attacks that may last weeks to months. There may be long periods between attacks in which there are almost no symptoms. Primary progressive MS causes symptoms to steadily progress after they develop. Symptoms of MS vary because of the many different ways it affects the central nervous system. The main symptoms include:  Vision problems and eye pain.  Numbness and weakness.  Inability to move your arms, hands, feet, or legs (paralysis).  Balance problems.  Shaking that you cannot control (tremors).  Muscle spasms.  Problems with thinking (cognitive changes). MS can also cause symptoms that are associated with the disease, but are not always the direct result of an MS attack. They may include:  Inability to control urination or bowel movements (incontinence).  Headaches.  Fatigue.  Inability to tolerate heat.  Emotional changes.  Depression.  Pain. How is this diagnosed? This condition is diagnosed based on:  Your symptoms.  A neurological exam. This involves checking central nervous system function, such as nerve function, reflexes, and coordination.  MRIs of the brain and spinal cord.  Lab tests, including a lumbar puncture that tests the fluid that surrounds the brain and spinal cord (cerebrospinal fluid).  Tests to measure the electrical activity of the brain in response to stimulation (evoked potentials). How is this treated? There is no cure for MS, but medicines can help decrease the number and frequency of attacks and help relieve nuisance symptoms. Treatment options may include:  Medicines that reduce the frequency of attacks. These medicines may be given by injection, by mouth (orally), or through an IV.  Medicines that reduce inflammation (steroids). These may provide short-term relief of symptoms.  Medicines to help control pain, depression, fatigue, or incontinence.  Nutritional counseling. Vitamin D supplements, if you have a  deficiency.  Using devices to help you move around (assistive devices), such as braces, a cane, or a walker.  Physical therapy to strengthen and stretch your muscles.  Occupational therapy to help you with everyday tasks.  Alternative or complementary treatments such as exercise, massage, or acupuncture.   Follow these instructions at home:  Take over-the-counter and prescription medicines only as told by your health care provider.  Do not drive or use heavy machinery while taking prescription pain medicine.  Use assistive devices as recommended by your physical therapist or your health care provider.  Exercise as directed by your health care provider.  Eating healthy can help manage MS symptoms.  Return to your normal activities as told by your health care provider. Ask your health care provider what activities are safe for you.  Reach out for support. Share your feelings with friends, family, or a support group.  Keep all follow-up visits as told by your health care provider and therapists. This is important. Where to find more information  National Multiple Sclerosis Society: https://www.nationalmssociety.[place] of Neurological Disorders and Stroke: http://hendricks-barton.net/  [employer]ative [employer]: GourmetRating.dk Contact a health care provider if:  You feel depressed.  You develop new pain or numbness.  You have tremors.  You have problems with sexual function. Get help right away if:  You develop paralysis.  You develop numbness.  You have problems with your bladder or bowel function.  You develop double vision.  You lose vision in one or both eyes.  You develop suicidal thoughts.  You develop severe confusion. If you ever feel like you may hurt yourself or others, or have thoughts about taking your own life, get help right away. You can go to your nearest emergency department or call:  Your  local emergency services (911 in the [place]  A suicide crisis helpline, such as the [facility] at 432-546-8897. This is open 24 hours a day. Summary  Multiple sclerosis (MS) is a disease of the central nervous system that causes the body's immune system to destroy the protective covering (myelin sheath) around nerves in the brain.  There are 3 types of MS: relapsing-remitting, secondary progressive, and primary progressive. Relapsing-remitting and secondary progressive MS cause symptoms to occur in episodes or attacks that may last weeks to months. Primary progressive MS causes symptoms to steadily progress after they develop.  There is no cure for MS, but medicines can help decrease the number and frequency of attacks and help relieve nuisance symptoms. Treatment may also include physical or occupational therapy.  If you develop numbness, paralysis, vision problems, or other neurological symptoms, get help right away. This information is not intended to replace advice given to you by your health care provider. Make sure you discuss any questions you have with your health care provider. Document Revised: 01/08/2020 Document Reviewed: 01/08/2020 Elsevier Patient Education  2021 [employer].

## 2020-08-07 NOTE — Progress Notes (Signed)
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 Check JC Virus antibody with index, quantitative immunoglobulin panel, vitamin D 2.  Plan to start Tysabri or Ocrevus.  Hepatitis panel already checked.  If JC Virus antibody negative or weakly positive, will start Tysabri.  Otherwise, would start Ocrevus 3.  Follow up 6 months.   Subjective:  Donna Higgins is a 40 year old right-handed female who presents for multiple sclerosis.  History supplemented by ED and referring provider's notes.  She saw her ophthalmologist on 07/28/2020 for three days of worsening blurred vision and pressure in her right eye.  No headache or ocular pain.  Her ophthalmologist noted possible optic neuritis and sent her to the ED for imaging.  She was seen in the ED at [facility] where MRI of brain and orbits with and without contrast showed several hyperintense foci in the cerebral white matter bilaterally, most notable in the right frontal and temporoparietal regions but no abnormal enhancement.  There was also mild asymmetric edema and enhancement of the right optic nerve.  She was transferred to [facility] for management where she received 3 day course of Solu-Medrol 1gm for 3 days.  MRI of cervical and thoracic spine with and without contrast showed small non-enhancing lesion at C2 level and T11 level.   She underwent LP which demonstrated CSF cell count 0, protein 31, glucose 106, IgG 2.7, negative culture, negative measles/rubella/mumps/VZV/HSV 1&2/West Nile , ACE negative, nonreactive VDRL, negative Lyme.  Serum labs included negative NMO/AQP4, negative anti-MOG, sed rate 1, CRP <3, non-reactive RPR, negative HIV, B12 437, folate  8.2, TSH 0.13, and negative acute viral hepatitis panel (HAV, HBV, HCV).  CBC with WBC 10.1, HGB 12.7, HCT 38.6, PLT 342, ALC 3; CMP with Na 140, K 3.8, Cl 106, CO2 23, glucose 93, BUN 13, Cr 0.66, Ca 9.4, ALP 66, t bili 0.26, AST 16, ALT <5.  She was discharged on prednisone taper.  Monday morning is last dose of prednisone.  Still with mild blurred but significantly improved.  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    Denies history of other neurologic conditions such as migraine.  No known family history of MS.  Her father was adopted.    PAST MEDICAL HISTORY: Past Medical History:  Diagnosis Date  . Decreased libido   . History of chicken pox   . Internal hemorrhoids without mention of complication   . Neuromuscular disorder ([place])    Phreesia 08/05/2020    PAST SURGICAL HISTORY: Past Surgical History:  Procedure Laterality Date  . CESAREAN SECTION  2009  . WISDOM TOOTH EXTRACTION      MEDICATIONS: Current Outpatient Medications on File Prior to Visit  Medication Sig Dispense Refill  . cholecalciferol (VITAMIN D3) 25 MCG (1000 UNIT) tablet Take 1,000 Units by mouth daily.    . predniSONE (DELTASONE) 20 MG tablet Take by mouth.     No current facility-administered medications on file prior to visit.    ALLERGIES: Allergies  Allergen Reactions  . Stadol [Butorphanol Tartrate]     Legs twitching, nausea    FAMILY HISTORY: Family History  Problem Relation Age of Onset  . Urolithiasis Mother   . Hypertension Mother   . Hyperlipidemia Mother   . Cancer Maternal Aunt        breast  . Diabetes Maternal Grandmother     Objective:  Blood pressure 116/78, pulse 83, height 5\' 4"  (1.626 m), weight 158 lb 3.2 oz (71.8 kg), SpO2 98 %. General: No acute distress.  Patient appears well-groomed.   Head:  Normocephalic/atraumatic Eyes:  fundi examined but not  visualized Neck: supple, no paraspinal tenderness, full range of motion Back: No paraspinal tenderness Heart: regular rate and rhythm Lungs: Clear to auscultation bilaterally. Vascular: No carotid bruits. Neurological Exam: Mental status: alert and oriented to person, place, and time, recent and remote memory intact, fund of knowledge intact, attention and concentration intact, speech fluent and not dysarthric, language intact. Cranial nerves: CN I: not tested CN II: Right APD; left pupil round and reactive to light, visual fields intact CN III, IV, VI:  full range of motion, no nystagmus, no ptosis CN V: facial sensation intact. CN VII: upper and lower face symmetric CN VIII: hearing intact CN IX, X: gag intact, uvula midline CN XI: sternocleidomastoid and trapezius muscles intact CN XII: tongue midline Bulk & Tone: normal, no fasciculations. Motor:  muscle strength 5/5 throughout Sensation:  Pinprick, temperature and vibratory sensation intact. Deep Tendon Reflexes:  2+ throughout,  toes downgoing.   Finger to nose testing:  Without dysmetria.   Heel to shin:  Without dysmetria.   Gait:  Normal station and stride.  Romberg negative.    Thank you for allowing me to take part in the care of this patient.  Metta Clines, DO  CC: Leretha Pol, NP

## 2020-08-07 NOTE — Telephone Encounter (Signed)
Patient states she has not heard about her ct scan for her chest. Patient would like it at [facility]. Patient was just in office. Please advise, thanks.

## 2020-08-07 NOTE — Telephone Encounter (Signed)
error 

## 2020-08-07 NOTE — Telephone Encounter (Signed)
Patient is at [facility] and states the order is there but they state is has not been authorized. Patient states they are needing it to be authorized.

## 2020-08-07 NOTE — Telephone Encounter (Signed)
Patient is advised that the PA has been started but not approved by insurance at this time. PA is requiring a peer to peer for authorization. Pt advised rendering facility will contact to schedule an appointment for her to have study done. AS, CMA

## 2020-08-08 ENCOUNTER — Encounter: Payer: Self-pay | Admitting: Nurse Practitioner

## 2020-08-11 ENCOUNTER — Encounter: Payer: Self-pay | Admitting: Nurse Practitioner

## 2020-08-14 LAB — IGG, IGA, IGM
IgG (Immunoglobin G), Serum: 876 mg/dL (ref 600–1640)
IgM, Serum: 88 mg/dL (ref 50–300)
Immunoglobulin A: 113 mg/dL (ref 47–310)

## 2020-08-14 LAB — STRATIFY JCV AB (W/ INDEX) W/ RFLX
Index Value: 0.16
Stratify JCV (TM) Ab w/Reflex Inhibition: NEGATIVE

## 2020-08-15 ENCOUNTER — Telehealth: Payer: Self-pay

## 2020-08-15 ENCOUNTER — Ambulatory Visit
Admission: RE | Admit: 2020-08-15 | Discharge: 2020-08-15 | Disposition: A | Discharge status: Home / Self Care | Payer: BC Managed Care – PPO | Source: Ambulatory Visit | Attending: Nurse Practitioner | Admitting: Nurse Practitioner

## 2020-08-15 DIAGNOSIS — G35 Multiple sclerosis: Secondary | ICD-10-CM

## 2020-08-15 DIAGNOSIS — Z87891 Personal history of nicotine dependence: Secondary | ICD-10-CM

## 2020-08-15 DIAGNOSIS — R590 Localized enlarged lymph nodes: Secondary | ICD-10-CM

## 2020-08-15 IMAGING — CT CT CHEST W/ CM
2 of 4 series · 15 of 36 positions shown, 18 images · IV contrast (iopamidol)
Comparison: None

CLINICAL DATA: Abnormal x-ray, adenopathy versus mass LEFT hilar
region. History multiple sclerosis, smoker

EXAM:
CT CHEST WITH CONTRAST
TECHNIQUE: Multidetector CT imaging of the chest was performed during
intravenous contrast administration. Sagittal and coronal MPR images
reconstructed from axial data set.
CONTRAST:  75mL [LS] IOPAMIDOL ([LS]) INJECTION 76% IV

[Series 2: chest 2.00 br40 s3 · axial · 0.73mm/px · z∈[+1697,+1931]mm · 12 of 139 slices shown, 15 images (1 of 2)]
[im 11/139  mediastinal]
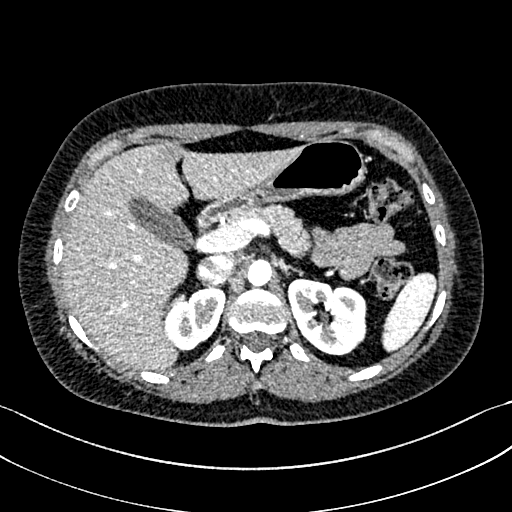
[im 11/139  lung]
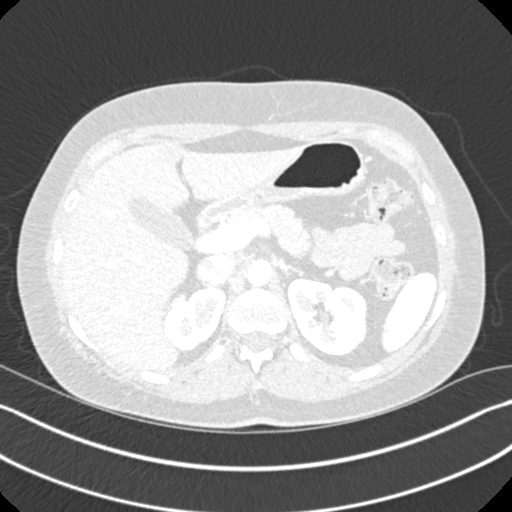
[im 22/139  lung]
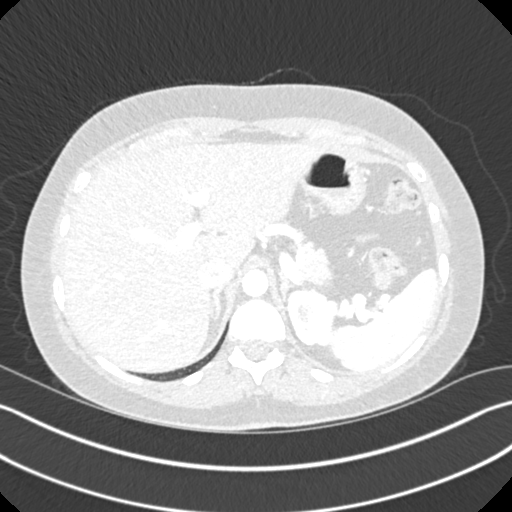
[im 32/139  lung]
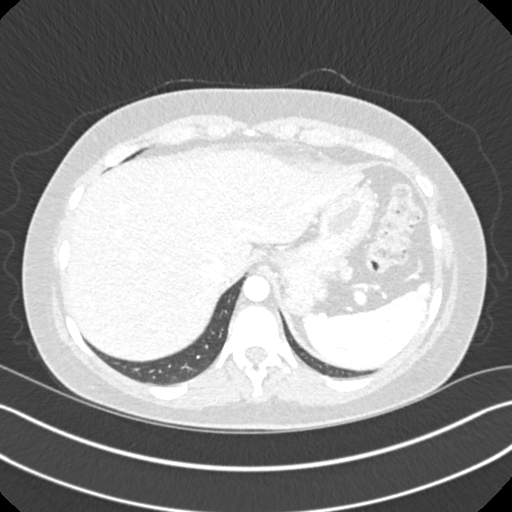
[im 43/139  lung]
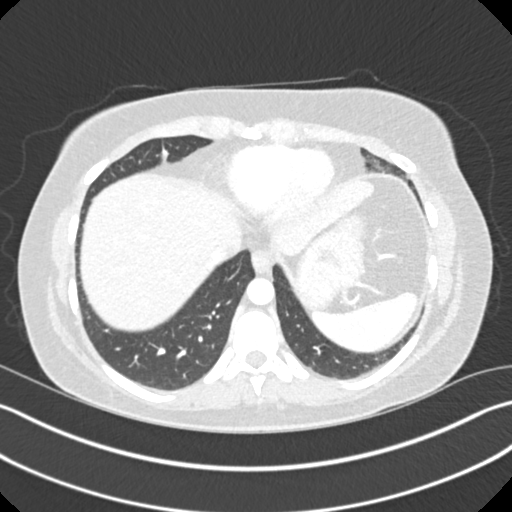
[im 54/139  mediastinal]
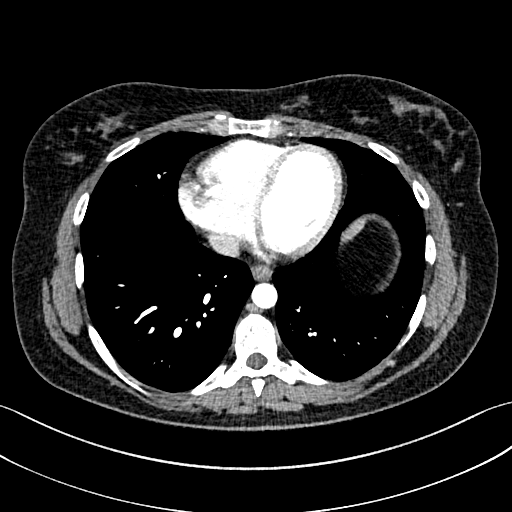
[im 54/139  lung]
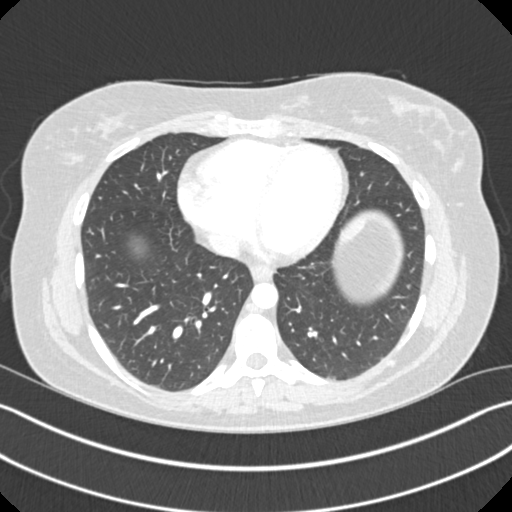
[im 64/139  lung]
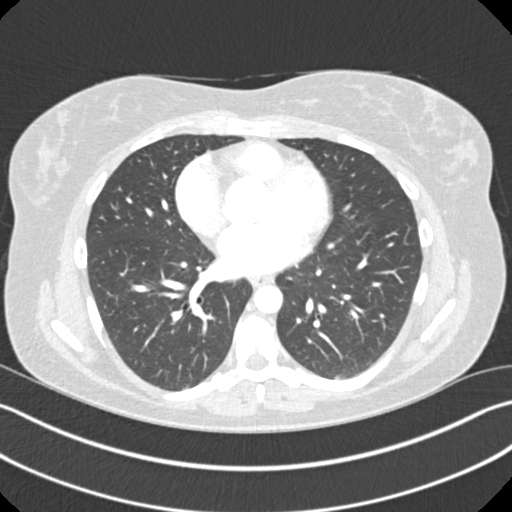
[im 75/139  lung]
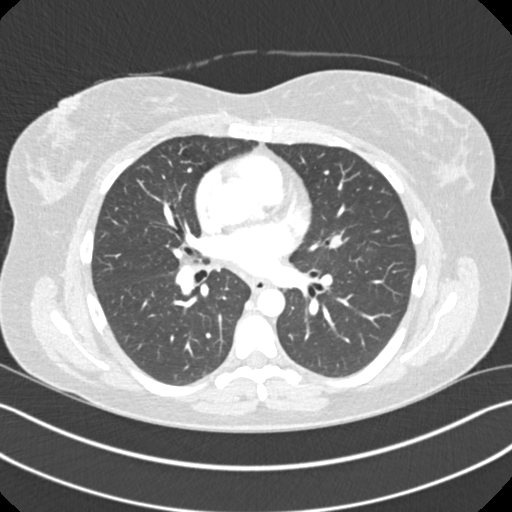
[im 85/139  lung]
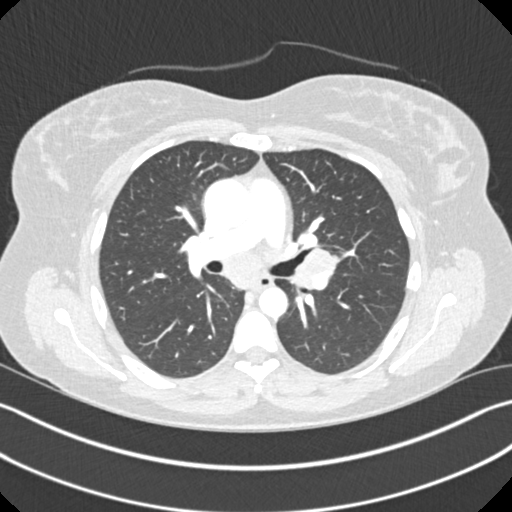
[im 96/139  mediastinal]
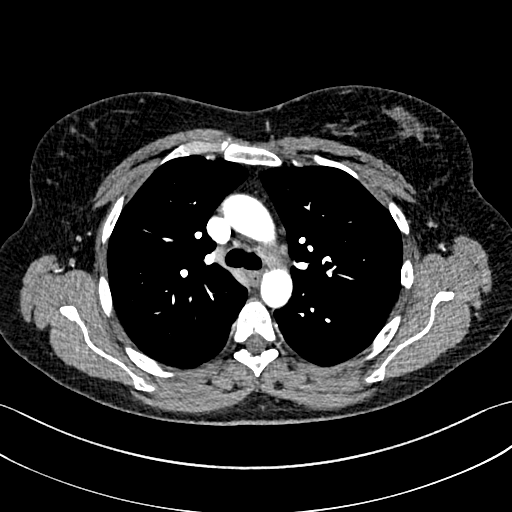
[im 96/139  lung]
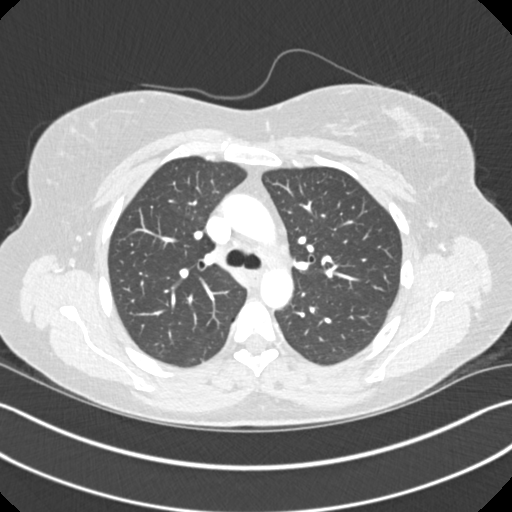
[im 107/139  lung]
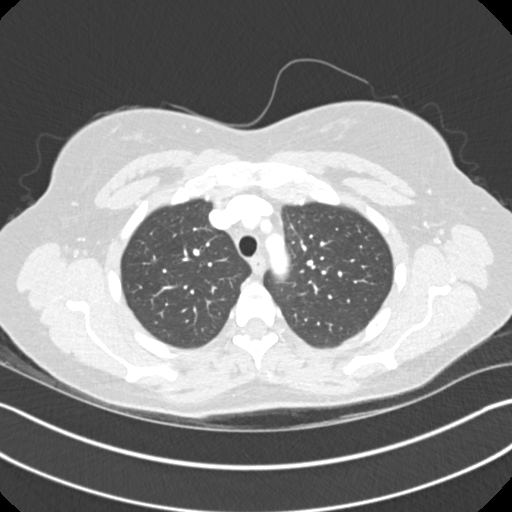
[im 117/139  lung]
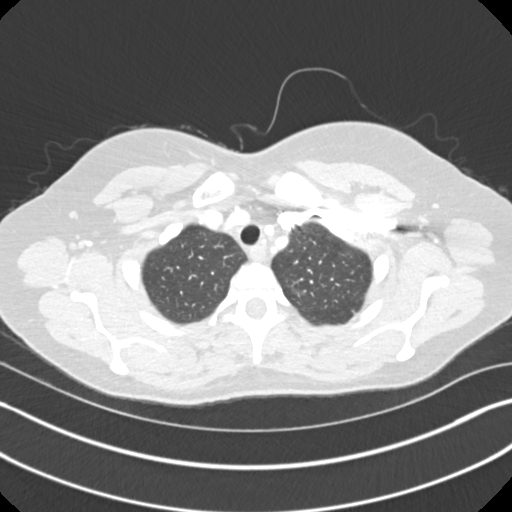
[im 128/139  lung]
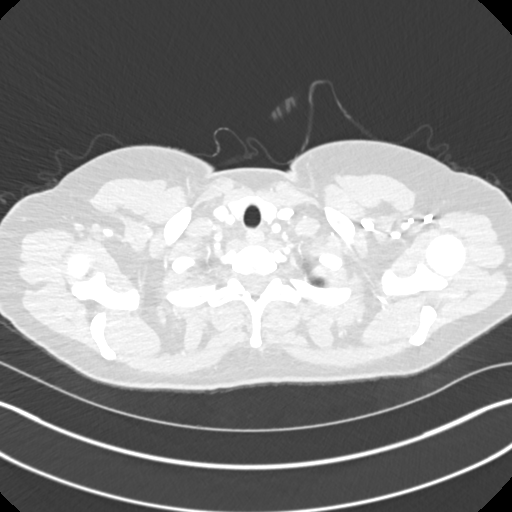

[Series 4: chest 2.00 br40 s3 · coronal · 0.55mm/px · 3 of 181 slices shown (2 of 2)]
[im 37/181  lung]
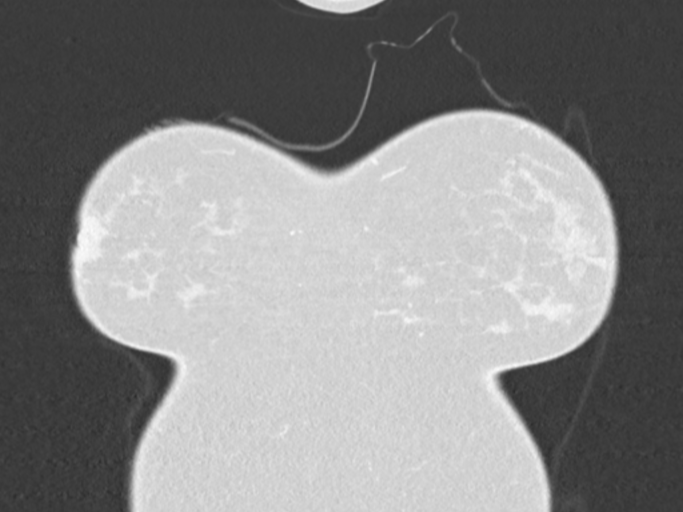
[im 73/181  lung]
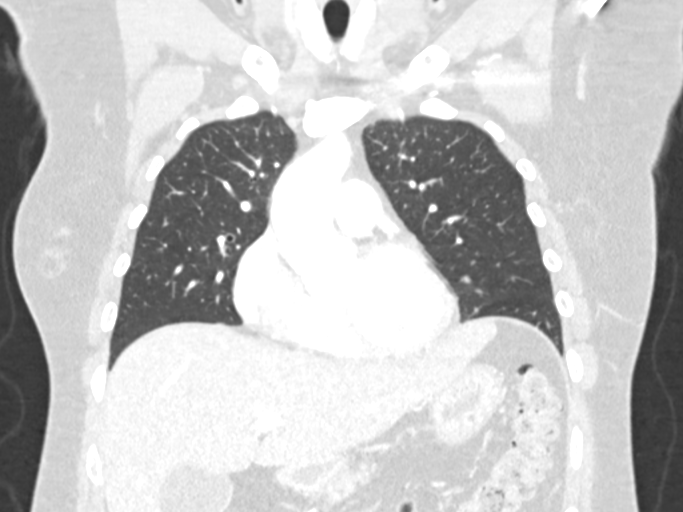
[im 109/181  lung]
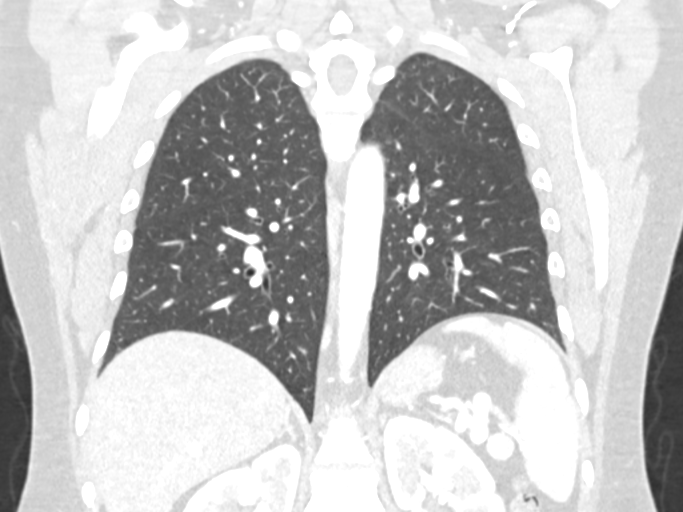

[15 of 36 positions shown; findings below may reference images not displayed]

FINDINGS: Cardiovascular: Vascular structures patent. Aorta normal caliber.
Heart unremarkable. No pericardial effusion.

Mediastinum/Nodes: Esophagus normal appearance. Base of cervical
region unremarkable. Subcarinal adenopathy with node measuring 23 mm
short axis. Borderline enlarged noted adjacent to LEFT mainstem
bronchus 10 mm diameter image 45. Enlarged LEFT hilar nodes 18 mm
diameter image 57 and 16 mm image 53. No axillary adenopathy.

Lungs/Pleura: Mass identified at LEFT major fissure 17 x 9 mm,
cortical question enlarged fissural lymph node. Additional nodule
centrally in lingula 9 x 8 mm could represent a bronchial node or
pulmonary nodule. No acute infiltrate, pleural effusion, or
pneumothorax.

Upper Abdomen: Probable small splenule adjacent to splenic hilum.
Remaining visualized upper abdomen unremarkable.

Musculoskeletal: No acute osseous findings.
IMPRESSION: Mediastinal and LEFT hilar adenopathy as above.

Two nodules are seen within the LEFT lung, 17 x 9 mm at LEFT major
fissure, 9 x 8 mm peribronchial in lingula, may represent additional
lymph nodes or less likely pulmonary nodules.

Differential diagnosis includes lymphoma, metastatic disease, and
infectious/inflammatory processes including sarcoidosis.

Consider PET-CT assessment.

## 2020-08-15 MED ORDER — IOPAMIDOL (ISOVUE-370) INJECTION 76%
75.0000 mL | Freq: Once | INTRAVENOUS | Status: AC | PRN
  Administered 2020-08-15: 75 mL via INTRAVENOUS

## 2020-08-15 NOTE — Telephone Encounter (Signed)
-----   Message from Pieter Partridge, DO sent at 08/14/2020  6:36 AM EDT ----- JC Virus antibody is negative.  I would like to proceed with starting Tysabri.  Repeat JC virus antibody with index, CBC with diff, CMP and vitamin D level in 6 months (just prior to follow up - still needs to schedule the follow up appointment)

## 2020-08-15 NOTE — Telephone Encounter (Signed)
Pt advised of note below and to stop by the office to signn Tysabri start form.

## 2020-08-18 ENCOUNTER — Encounter: Payer: Self-pay | Admitting: Nurse Practitioner

## 2020-08-18 ENCOUNTER — Other Ambulatory Visit: Payer: Self-pay | Admitting: Nurse Practitioner

## 2020-08-18 DIAGNOSIS — Z9189 Other specified personal risk factors, not elsewhere classified: Secondary | ICD-10-CM

## 2020-08-18 DIAGNOSIS — R911 Solitary pulmonary nodule: Secondary | ICD-10-CM

## 2020-08-18 NOTE — Progress Notes (Signed)
Will need referral to pulmonology for further evaluation of pulmonary nodules. Will discuss with patient at visit 08/22/2020

## 2020-08-20 ENCOUNTER — Encounter: Payer: Self-pay | Admitting: Nurse Practitioner

## 2020-08-22 ENCOUNTER — Encounter: Payer: Self-pay | Admitting: Nurse Practitioner

## 2020-08-22 ENCOUNTER — Other Ambulatory Visit: Payer: Self-pay

## 2020-08-22 ENCOUNTER — Ambulatory Visit: Payer: BC Managed Care – PPO | Admitting: Nurse Practitioner

## 2020-08-22 VITALS — BP 88/63 | HR 84 | Temp 99.4°F | Ht 64.0 in | Wt 165.6 lb

## 2020-08-22 DIAGNOSIS — G35 Multiple sclerosis: Secondary | ICD-10-CM | POA: Diagnosis not present

## 2020-08-22 DIAGNOSIS — R590 Localized enlarged lymph nodes: Secondary | ICD-10-CM

## 2020-08-22 NOTE — Patient Instructions (Signed)
Pulmonary Nodule  A pulmonary nodule is a small, round growth of tissue in the lung. A nodule may be cancer, but most nodules are not cancer. What are the causes?  Infection from a germ (bacteria, fungus, or virus), such as tuberculosis.  Tissue that is cancer, such as: ? Cancer in the lung. ? Cancer that has spread to the lung from another part of the body.  A growth of tissue (mass) that is not cancer.  Swelling and irritation from conditions such as rheumatoid arthritis.  Having blood vessels that are not normal in the lungs. What are the signs or symptoms? Many times, there are no symptoms. If you get symptoms, they normally have another cause, such as infection. How is this treated? Treatment depends on:  If your nodule is cancer or if it is not cancer.  What your risk of getting cancer is. Some nodules are not cancer. If this is the case for you, you may not need treatment. Your doctor may do tests to watch the nodule for changes. If the nodule is cancer:  You will need tests, such as CT and PET scans.  You may need treatment. This may include: ? Surgery. ? Treatment with high-energy X-rays (radiation therapy). ? Medicines. Some nodules need to be taken out. You may have a procedure to have the nodule taken out. During the procedure, your doctor will make a cut (incision) into your chest and take out the part of your lung that has the nodule. Follow these instructions at home:  Take over-the-counter and prescription medicines only as told by your doctor.  Do not smoke or use any products that contain nicotine or tobacco. If you need help quitting, ask your doctor.  Keep all follow-up visits. Contact a doctor if:  You have pain in your chest, back, or shoulder.  You are short of breath or have trouble breathing when you are active.  You get a cough.  Your voice starts to sound raspy, breathy, or strained (hoarse), and you do not know why.  You feel sick or more  tired than normal.  You do not feel like eating.  You lose weight without trying.  You get chills, or you start to sweat a lot during sleep.  You need two or more pillows to sleep on at night.  You have: ? A fever and your symptoms get worse all of a sudden. ? A fever or symptoms for more than 2-3 days. Get help right away if:  You cannot catch your breath.  You have sudden chest pain.  You start making high-pitched whistling sounds when you breathe, most often when you breathe out (you wheeze).  You cannot stop coughing.  You cough up blood or bloody mucus from your lungs (sputum).  You get dizzy or feel like you may faint. These symptoms may represent a serious problem that is an emergency. Do not wait to see if the symptoms will go away. Get medical help right away. Call your local emergency services (911 in the [place] Do not drive yourself to the hospital. Summary  A pulmonary nodule is a small, round growth of tissue in the lung. Most of these nodules are not cancer.  Common causes of nodules in the lung include infection, swelling and irritation, and growths that are not cancer.  Treatment depends on whether the nodule is cancer or is not cancer. Treatment also depends on your risk of getting cancer.  If the nodule is cancer, you will need  certain tests and treatments as told by your doctor. This information is not intended to replace advice given to you by your health care provider. Make sure you discuss any questions you have with your health care provider. Document Revised: 10/17/2019 Document Reviewed: 10/17/2019 Elsevier Patient Education  [place].

## 2020-08-22 NOTE — Progress Notes (Signed)
Established Patient Office Visit  Subjective:  Patient ID: Donna Higgins, female    DOB: Mar 14, 1981  Age: 40 y.o. MRN: [id number]  CC:  Chief Complaint  Patient presents with  . Follow-up    View CT Results    HPI Donna Higgins presents for follow up of CT scan results. She had CT chest prior to this visit.  Ct scan is showing multiple pulmonary nodules. There is question as to whether this represents metastatic disease, lymphoma, sarcoid, or evidence of other pulmonary disease. A PET scan has been recommended. She has already been referred to pulmonology for further evaluation.  She has seen neurology. They have recommended she start on medication for multiple sclerosis, Tasybri. The results of chest CT scan have come in after the recommendation for treatment. She is no unsure if she should start the medication prior to consulting with pulmonology. Current information does not indicate that pulmonary disease is an indication for this treatment. She plans to discuss with neurology prior to starting this medication.  She denies new concerns or complaints. She denies denies chest pain, chest pressure, or shortness of breath. She denies headaches or visual disturbances. She denies abdominal pain, nausea, vomiting, or changes in bowel or bladder habits.     Past Medical History:  Diagnosis Date  . Decreased libido   . History of chicken pox   . Internal hemorrhoids without mention of complication   . Neuromuscular disorder [facility])    [employer]reesia 08/05/2020    Past Surgical History:  Procedure Laterality Date  . CESAREAN SECTION  2009  . WISDOM TOOTH EXTRACTION      Family History  Problem Relation Age of Onset  . Urolithiasis Mother   . Hypertension Mother   . Hyperlipidemia Mother   . Cancer Maternal Aunt        breast  . Diabetes Maternal Grandmother     Social History   Socioeconomic History  . Marital status: Married    Spouse name: Not on file  . Number of children: Not  on file  . Years of education: Not on file  . Highest education level: Not on file  Occupational History  . Not on file  Tobacco Use  . Smoking status: Current Some Day Smoker  . Smokeless tobacco: Never Used  Substance and Sexual Activity  . Alcohol use: Yes  . Drug use: No  . Sexual activity: Yes    Comment: Husband vasectomy  Other Topics Concern  . Not on file  Social History Narrative   Right handed   Social Determinants of Health   Financial Resource Strain: Not on file  Food Insecurity: Not on file  Transportation Needs: Not on file  Physical Activity: Not on file  Stress: Not on file  Social Connections: Not on file  Intimate Partner Violence: Not on file    Outpatient Medications Prior to Visit  Medication Sig Dispense Refill  . Beta Carotene (VITAMIN A) 25000 UNIT capsule Take 25,000 Units by mouth daily.    . cholecalciferol (VITAMIN D3) 25 MCG (1000 UNIT) tablet Take 1,000 Units by mouth daily.     No facility-administered medications prior to visit.    Allergies  Allergen Reactions  . Stadol [Butorphanol Tartrate]     Legs twitching, nausea    ROS Review of Systems  Constitutional: Negative for activity change, chills, fatigue and fever.  HENT: Negative for congestion, postnasal drip, rhinorrhea, sinus pressure and sinus pain.   Eyes: Negative.  Respiratory: Negative for cough, chest tightness, shortness of breath and wheezing.   Cardiovascular: Negative for chest pain and palpitations.  Gastrointestinal: Negative for constipation, diarrhea, nausea and vomiting.  Endocrine: Negative for cold intolerance, heat intolerance, polydipsia and polyuria.  Musculoskeletal: Negative for arthralgias, back pain, myalgias and neck stiffness.  Skin: Negative for rash.  Allergic/Immunologic: Negative.   Neurological: Negative for dizziness, weakness and headaches.  Hematological: Negative.   Psychiatric/Behavioral: Negative for decreased concentration,  dysphoric mood and sleep disturbance. The patient is not nervous/anxious.       Objective:    Physical Exam Vitals and nursing note reviewed.  Constitutional:      Appearance: Normal appearance. She is well-developed.  HENT:     Head: Normocephalic and atraumatic.     Nose: Nose normal.  Eyes:     Extraocular Movements: Extraocular movements intact.     Conjunctiva/sclera: Conjunctivae normal.     Pupils: Pupils are equal, round, and reactive to light.  Cardiovascular:     Rate and Rhythm: Normal rate and regular rhythm.     Pulses: Normal pulses.     Heart sounds: Normal heart sounds.  Pulmonary:     Effort: Pulmonary effort is normal.     Breath sounds: Normal breath sounds.  Abdominal:     Palpations: Abdomen is soft.  Musculoskeletal:        General: Normal range of motion.     Cervical back: Normal range of motion and neck supple.  Skin:    General: Skin is warm and dry.     Capillary Refill: Capillary refill takes less than 2 seconds.  Neurological:     General: No focal deficit present.     Mental Status: She is alert and oriented to person, place, and time.  Psychiatric:        Mood and Affect: Mood normal.        Behavior: Behavior normal.        Thought Content: Thought content normal.        Judgment: Judgment normal.     Today's Vitals   08/22/20 1030  BP: (!) 88/63  Pulse: 84  Temp: 99.4 F (37.4 C)  SpO2: 97%  Weight: 165 lb 9.6 oz (75.1 kg)  Height: 5\' 4"  (1.626 m)   Body mass index is 28.43 kg/m.   Wt Readings from Last 3 Encounters:  08/26/20 163 lb 9.6 oz (74.2 kg)  08/22/20 165 lb 9.6 oz (75.1 kg)  08/07/20 158 lb 3.2 oz (71.8 kg)     Health Maintenance Due  Topic Date Due  . Hepatitis C Screening  Never done  . PAP SMEAR-Modifier  07/21/2018    There are no preventive care reminders to display for this patient.  Lab Results  Component Value Date   TSH 0.83 05/22/2012   Lab Results  Component Value Date   WBC 5.8  08/26/2020   HGB 12.8 08/26/2020   HCT 37.9 08/26/2020   MCV 90.2 08/26/2020   PLT 305.0 08/26/2020   Lab Results  Component Value Date   NA 137 08/26/2020   K 4.0 08/26/2020   CO2 28 08/26/2020   GLUCOSE 88 08/26/2020   BUN 13 08/26/2020   CREATININE 0.70 08/26/2020   BILITOT 0.5 05/22/2012   ALKPHOS 90 05/22/2012   AST 18 05/22/2012   ALT 16 05/22/2012   PROT 7.2 05/22/2012   ALBUMIN 4.3 05/22/2012   CALCIUM 9.5 08/26/2020   GFR 108.26 08/26/2020   Lab Results  Component  Value Date   CHOL 176 05/22/2012   Lab Results  Component Value Date   HDL 52.70 05/22/2012   Lab Results  Component Value Date   LDLCALC 110 (H) 05/22/2012   Lab Results  Component Value Date   TRIG 69.0 05/22/2012   Lab Results  Component Value Date   CHOLHDL 3 05/22/2012   No results found for: HGBA1C    Assessment & Plan:  1. Hilar lymphadenopathy Reviewed results of chest CT with patient. Sows multiple pulmonary nodules which are concerning for different pulmonary problems including metastatic disease, lymphoma, or sarcoid. A PET scan was recommended and has been ordered. She has been referred to pulmonology for further evaluation and treatment.   2. Multiple sclerosis [facility]) Patient now seeing neurologist who has recommended treatment with Tasybri. She plans to discuss CT findings with neurologist prior to starting this medication.   Problem List Items Addressed This Visit      Cardiovascular and Mediastinum   Hilar lymphadenopathy - Primary     Nervous and Auditory   Multiple sclerosis ([place])        Follow-up: Return in about 3 months (around 11/22/2020) for follow up.    Ronnell Freshwater, NP

## 2020-08-26 ENCOUNTER — Other Ambulatory Visit: Payer: Self-pay

## 2020-08-26 ENCOUNTER — Encounter: Payer: Self-pay | Admitting: Emergency Medicine

## 2020-08-26 ENCOUNTER — Ambulatory Visit (INDEPENDENT_AMBULATORY_CARE_PROVIDER_SITE_OTHER): Payer: BC Managed Care – PPO | Admitting: Emergency Medicine

## 2020-08-26 VITALS — BP 118/84 | HR 92 | Temp 98.4°F | Ht 64.0 in | Wt 163.6 lb

## 2020-08-26 DIAGNOSIS — R590 Localized enlarged lymph nodes: Secondary | ICD-10-CM | POA: Diagnosis not present

## 2020-08-26 NOTE — Progress Notes (Signed)
Subjective:    Patient ID: Donna Higgins, female    DOB: 07-Jul-1980, 40 y.o.   MRN: [id number]  HPI 40 year old woman with history of tobacco use (5-7 pack years), newly diagnosed with multiple sclerosis after she developed blurred vision and eye pressure MRI of the brain confirmed hyperintense foci in the cerebral white matter.  She referred today for abnormal CT scan of the chest.  She underwent CT chest 08/15/2020 as below.  Apparently a PET scan was ordered to further evaluate but this has not yet been done as it was not approved by her insurance.  CT chest 08/15/2020 reviewed by me, shows subcarinal adenopathy 2.3 cm, enlarged left hilar nodes without any axillary findings. There is a 17 x 9 mm nodule in the left major fissure, 9 x 8 mm nodule in the lingula   Review of Systems As per HPI  Past Medical History:  Diagnosis Date  . Decreased libido   . History of chicken pox   . Internal hemorrhoids without mention of complication   . Neuromuscular disorder ([place])    [employer]reesia 08/05/2020    No hx malignancy  Family History  Problem Relation Age of Onset  . Urolithiasis Mother   . Hypertension Mother   . Hyperlipidemia Mother   . Cancer Maternal Aunt        breast  . Diabetes Maternal Grandmother     No family hx lung CA  Social History   Socioeconomic History  . Marital status: Married    Spouse name: Not on file  . Number of children: Not on file  . Years of education: Not on file  . Highest education level: Not on file  Occupational History  . Not on file  Tobacco Use  . Smoking status: Current Some Day Smoker  . Smokeless tobacco: Never Used  Substance and Sexual Activity  . Alcohol use: Yes  . Drug use: No  . Sexual activity: Yes    Comment: Husband vasectomy  Other Topics Concern  . 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   Allergies  Allergen Reactions  . Stadol [Butorphanol Tartrate]     Legs twitching, nausea     Outpatient Medications Prior to Visit  Medication Sig Dispense Refill  . Beta Carotene (VITAMIN A) 25000 UNIT capsule Take 25,000 Units by mouth daily.    . cholecalciferol (VITAMIN D3) 25 MCG (1000 UNIT) tablet Take 1,000 Units by mouth daily.     No facility-administered medications prior to visit.        Objective:   Physical Exam Vitals:   08/26/20 1500  BP: 118/84  Pulse: 92  Temp: 98.4 F (36.9 C)  TempSrc: Temporal  SpO2: 98%  Weight: 163 lb 9.6 oz (74.2 kg)  Height: 5\' 4"  (1.626 m)   Gen: Pleasant, well-nourished, in no distress,  normal affect  ENT: No lesions,  mouth clear,  oropharynx clear, no postnasal drip  Neck: No JVD, no stridor  Lungs: No use of accessory muscles, no crackles or wheezing on normal respiration, no wheeze on forced expiration  Cardiovascular: RRR, heart  sounds normal, no murmur or gallops, no peripheral edema  Musculoskeletal: No deformities, no cyanosis or clubbing  Neuro: alert, awake, non focal  Skin: Warm, no lesions or rash      Assessment & Plan:  Hilar lymphadenopathy Hilar and subcarinal bulky lymphadenopathy with an irregular left pulmonary nodule that may also represent a lymph node.  Suspicious for sarcoidosis although consider primary malignancy, consider lymphoma or other inflammatory process.  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  My reading would support the possibility that sarcoidosis may be a unifying diagnosis, that brain imaging and neurosarcoidosis can look  very similar to MS and that neurosarcoid optic neuritis can be indistinguishable from [place]. I recommended that we pursue tissue diagnosis with bronchoscopy, EBUS, ENB as soon as feasible.  I believe this needs to be done expediently as the results will definitely impact which ever treatment strategy is planned by neurology.  Baltazar Apo, MD, PhD 08/26/2020, 5:38 PM  Pulmonary and Critical Care [phone] or if no answer before 7:00PM call [phone] For any issues after 7:00PM please call eLink [phone]

## 2020-08-26 NOTE — H&P (View-Only) (Signed)
Subjective:    Patient ID: Donna Higgins, female    DOB: July 25, 1980, 40 y.o.   MRN: [id number]  HPI 40 year old woman with history of tobacco use (5-7 pack years), newly diagnosed with multiple sclerosis after she developed blurred vision and eye pressure MRI of the brain confirmed hyperintense foci in the cerebral white matter.  She referred today for abnormal CT scan of the chest.  She underwent CT chest 08/15/2020 as below.  Apparently a PET scan was ordered to further evaluate but this has not yet been done as it was not approved by her insurance.  CT chest 08/15/2020 reviewed by me, shows subcarinal adenopathy 2.3 cm, enlarged left hilar nodes without any axillary findings. There is a 17 x 9 mm nodule in the left major fissure, 9 x 8 mm nodule in the lingula   Review of Systems As per HPI  Past Medical History:  Diagnosis Date  . Decreased libido   . History of chicken pox   . Internal hemorrhoids without mention of complication   . Neuromuscular disorder ([place])    [employer]reesia 08/05/2020    No hx malignancy  Family History  Problem Relation Age of Onset  . Urolithiasis Mother   . Hypertension Mother   . Hyperlipidemia Mother   . Cancer Maternal Aunt        breast  . Diabetes Maternal Grandmother     No family hx lung CA  Social History   Socioeconomic History  . Marital status: Married    Spouse name: Not on file  . Number of children: Not on file  . Years of education: Not on file  . Highest education level: Not on file  Occupational History  . Not on file  Tobacco Use  . Smoking status: Current Some Day Smoker  . Smokeless tobacco: Never Used  Substance and Sexual Activity  . Alcohol use: Yes  . Drug use: No  . Sexual activity: Yes    Comment: Husband vasectomy  Other Topics Concern  . 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   Allergies  Allergen Reactions  . Stadol [Butorphanol Tartrate]     Legs twitching, nausea     Outpatient Medications Prior to Visit  Medication Sig Dispense Refill  . Beta Carotene (VITAMIN A) 25000 UNIT capsule Take 25,000 Units by mouth daily.    . cholecalciferol (VITAMIN D3) 25 MCG (1000 UNIT) tablet Take 1,000 Units by mouth daily.     No facility-administered medications prior to visit.        Objective:   Physical Exam Vitals:   08/26/20 1500  BP: 118/84  Pulse: 92  Temp: 98.4 F (36.9 C)  TempSrc: Temporal  SpO2: 98%  Weight: 163 lb 9.6 oz (74.2 kg)  Height: 5\' 4"  (1.626 m)   Gen: Pleasant, well-nourished, in no distress,  normal affect  ENT: No lesions,  mouth clear,  oropharynx clear, no postnasal drip  Neck: No JVD, no stridor  Lungs: No use of accessory muscles, no crackles or wheezing on normal respiration, no wheeze on forced expiration  Cardiovascular: RRR, heart  sounds normal, no murmur or gallops, no peripheral edema  Musculoskeletal: No deformities, no cyanosis or clubbing  Neuro: alert, awake, non focal  Skin: Warm, no lesions or rash      Assessment & Plan:  Hilar lymphadenopathy Hilar and subcarinal bulky lymphadenopathy with an irregular left pulmonary nodule that may also represent a lymph node.  Suspicious for sarcoidosis although consider primary malignancy, consider lymphoma or other inflammatory process.  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  My reading would support the possibility that sarcoidosis may be a unifying diagnosis, that brain imaging and neurosarcoidosis can look  very similar to MS and that neurosarcoid optic neuritis can be indistinguishable from [place]. I recommended that we pursue tissue diagnosis with bronchoscopy, EBUS, ENB as soon as feasible.  I believe this needs to be done expediently as the results will definitely impact which ever treatment strategy is planned by neurology.  Baltazar Apo, MD, PhD 08/26/2020, 5:38 PM [facility] [facility] [phone] or if no answer before 7:00PM call [phone] For any issues after 7:00PM please call eLink [phone]

## 2020-08-26 NOTE — Patient Instructions (Signed)
Work on scheduling bronchoscopy.  This will be done under general anesthesia at [facility] as an outpatient.  You will need a designated driver. We will re-order your PET scan Follow Dr. Lamonte Sakai next available after your procedure to discuss results.

## 2020-08-26 NOTE — Assessment & Plan Note (Signed)
Hilar and subcarinal bulky lymphadenopathy with an irregular left pulmonary nodule that may also represent a lymph node.  Suspicious for sarcoidosis although consider primary malignancy, consider lymphoma or other inflammatory process.  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  My reading would support the possibility that sarcoidosis may be a unifying diagnosis, that brain imaging and neurosarcoidosis can look very similar to MS and that neurosarcoid optic neuritis can be indistinguishable from [place]. I recommended that we pursue tissue diagnosis with bronchoscopy, EBUS, ENB as soon as feasible.  I believe this needs to be done expediently as the results will definitely impact which ever treatment strategy is planned by neurology.

## 2020-08-27 ENCOUNTER — Telehealth: Payer: Self-pay | Admitting: Emergency Medicine

## 2020-08-27 LAB — CBC WITH DIFFERENTIAL/PLATELET
Basophils Absolute: 0 10*3/uL (ref 0.0–0.1)
Basophils Relative: 0.6 % (ref 0.0–3.0)
Eosinophils Absolute: 0.2 10*3/uL (ref 0.0–0.7)
Eosinophils Relative: 3 % (ref 0.0–5.0)
HCT: 37.9 % (ref 36.0–46.0)
Hemoglobin: 12.8 g/dL (ref 12.0–15.0)
Lymphocytes Relative: 33.6 % (ref 12.0–46.0)
Lymphs Abs: 2 10*3/uL (ref 0.7–4.0)
MCHC: 33.7 g/dL (ref 30.0–36.0)
MCV: 90.2 fl (ref 78.0–100.0)
Monocytes Absolute: 0.3 10*3/uL (ref 0.1–1.0)
Monocytes Relative: 4.3 % (ref 3.0–12.0)
Neutro Abs: 3.4 10*3/uL (ref 1.4–7.7)
Neutrophils Relative %: 58.5 % (ref 43.0–77.0)
Platelets: 305 10*3/uL (ref 150.0–400.0)
RBC: 4.2 Mil/uL (ref 3.87–5.11)
RDW: 13.1 % (ref 11.5–15.5)
WBC: 5.8 10*3/uL (ref 4.0–10.5)

## 2020-08-27 LAB — BASIC METABOLIC PANEL
BUN: 13 mg/dL (ref 6–23)
CO2: 28 mEq/L (ref 19–32)
Calcium: 9.5 mg/dL (ref 8.4–10.5)
Chloride: 102 mEq/L (ref 96–112)
Creatinine, Ser: 0.7 mg/dL (ref 0.40–1.20)
GFR: 108.26 mL/min (ref >60.00)
Glucose, Bld: 88 mg/dL (ref 70–99)
Potassium: 4 mEq/L (ref 3.5–5.1)
Sodium: 137 mEq/L (ref 135–145)

## 2020-08-27 LAB — PROTIME-INR
INR: 1 ratio (ref 0.8–1.0)
Prothrombin Time: 11.3 s (ref 9.6–13.1)

## 2020-08-27 NOTE — Telephone Encounter (Signed)
Pt informed of the following:  Covid test: 6/3 @ 2:55pm  [place] 6/6 @ 7:30; 5:30am arrival time.  Rec'd super D disk from GI from CT on 5/6 and interofficed [facility].

## 2020-08-29 NOTE — Telephone Encounter (Signed)
PCCs: Can someone update patient on if PET scan scheduled for Monday has been approved?  Thank you.

## 2020-08-29 NOTE — Telephone Encounter (Signed)
Looks like PET was ordered by Leretha Pol.  That office is who would be getting the precert - we didn't order it.  PET is still showing scheduled for Monday.  Pt needs to contact [employer] office.  Will route back to triage so they can make pt aware thru [place].

## 2020-09-01 ENCOUNTER — Ambulatory Visit (HOSPITAL_COMMUNITY)
Admission: RE | Admit: 2020-09-01 | Discharge: 2020-09-01 | Disposition: A | Discharge status: Home / Self Care | Payer: BC Managed Care – PPO | Source: Ambulatory Visit | Attending: Nurse Practitioner | Admitting: Nurse Practitioner

## 2020-09-01 ENCOUNTER — Other Ambulatory Visit: Payer: Self-pay

## 2020-09-01 DIAGNOSIS — Z9189 Other specified personal risk factors, not elsewhere classified: Secondary | ICD-10-CM | POA: Insufficient documentation

## 2020-09-01 DIAGNOSIS — R911 Solitary pulmonary nodule: Secondary | ICD-10-CM | POA: Insufficient documentation

## 2020-09-01 LAB — GLUCOSE, CAPILLARY: Glucose-Capillary: 88 mg/dL (ref 70–99)

## 2020-09-01 IMAGING — PT NM PET TUM IMG INITIAL (PI) SKULL BASE T - THIGH
1 of 7 series · 1 of 25 positions shown · non-contrast
Comparison: Chest CT [DATE]

CLINICAL DATA: Initial treatment strategy for mediastinal
adenopathy. LEFT lung nodule.

EXAM:
NUCLEAR MEDICINE PET SKULL BASE TO THIGH
TECHNIQUE: 8.1 mCi F-18 FDG was injected intravenously. Full-ring PET imaging
was performed from the skull base to thigh after the radiotracer. CT
data was obtained and used for attenuation correction and anatomic
localization.
Fasting blood glucose: 88 mg/dl

[Series 4: ct sk_thigh 5.0 bf37 · axial · 5.0mm · 0.98mm/px · 1 of 209 slices shown]
[im 209/209  brain]
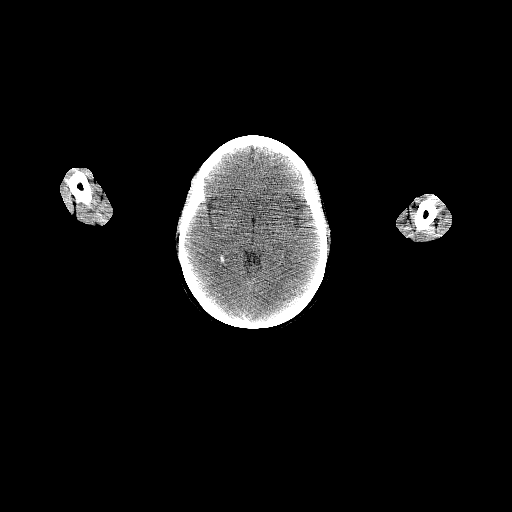

[1 of 25 positions shown; findings below may reference images not displayed]

FINDINGS: Mediastinal blood pool activity: SUV max

Liver activity: SUV max NA

NECK: No hypermetabolic lymph nodes in the neck.

Incidental CT findings: none

CHEST: Two adjacent hypermetabolic nodules in the LEFT upper lobe on
image 68/4. Larger nodule measures 15 mm with SUV max equal 4.0.
Smaller nodule measures 8 mm with SUV max equal 3.3.

Large hypermetabolic LEFT hilar nodule/node measures 29 mm with SUV
max equal 9.3 (image 63). Enlarged hypermetabolic subcarinal lymph
node measures 23 mm with SUV max equal 10.1.

No hypermetabolic supraclavicular nodes. No hypermetabolic nodules
in the RIGHT lung.

Incidental CT findings: none

ABDOMEN/PELVIS: No abnormal hypermetabolic activity within the
liver, pancreas, adrenal glands, or spleen. No hypermetabolic lymph
nodes in the abdomen or pelvis.

Incidental CT findings: Splenule at the splenic hilum. Uterus and
ovaries appear normal.

SKELETON: No focal hypermetabolic activity to suggest skeletal
metastasis.

Incidental CT findings: none
IMPRESSION: 1. Two adjacent hypermetabolic pulmonary nodules in the LEFT upper
lobe are concerning for bronchogenic carcinoma.
2. Hypermetabolic LEFT hilar mass and subcarinal node most
concerning for metastatic adenopathy. Consider small cell lung
cancer and recommend tissue sampling.
3. No evidence of distant metastatic disease.

## 2020-09-01 MED ORDER — FLUDEOXYGLUCOSE F - 18 (FDG) INJECTION
8.5000 | Freq: Once | INTRAVENOUS | Status: AC
  Administered 2020-09-01: 8.1 via INTRAVENOUS

## 2020-09-02 ENCOUNTER — Encounter: Payer: Self-pay | Admitting: Nurse Practitioner

## 2020-09-02 NOTE — Telephone Encounter (Signed)
Please advise on patient mychart message    Dr. Lamonte Sakai,  I had my PET scan yesterday and was told I should get results within 24 hours. I messaged my PCP who put in the request about why I hadn't gotten results. The office called and she is out of town until [employer].  I would really like to know these results before Thurs. Is there anyway you can see the results and let me know something?  I am concerned and would like to know.   Thank you,   Donna Higgins

## 2020-09-02 NOTE — Telephone Encounter (Signed)
Called patient to inform them that Nira Conn is out until Thursday of this week. Assured patient this will be forward to PCP and given a high importance. She verbally understood that results cannot be given out until her provider has reviewed them. Nolon Bussing, [place]

## 2020-09-03 NOTE — Telephone Encounter (Signed)
Received the following message from patient:   "Dr. Lamonte Sakai, The radiologist report is in my chart.   How do you feel? Are you leaning more towards a cancer diagnosis now and if so what does that mean for treatment and prognosis.   Does this mean the MS diagnosis is still correct or could it all still be sarcoidosis?  I Understand that the biopsy will be the most definitive test but you wanted to get his opinion because I trust you and value your opinion.  Ulla Potash"  Dr. Lamonte Sakai, can you please advise? Thanks!

## 2020-09-11 ENCOUNTER — Other Ambulatory Visit: Payer: Self-pay

## 2020-09-11 ENCOUNTER — Encounter (HOSPITAL_COMMUNITY): Payer: Self-pay | Admitting: Emergency Medicine

## 2020-09-11 NOTE — Progress Notes (Signed)
Donna Higgins denies chest pain or shortness or breath.  Donna Higgins denies s/s of Covid nor has she been in contact with anyone who has Covid, and has not bee in contact with anyone who has.  Donna Higgins will be tested for Covid on 09/12/20, I instructed patient that she will need to wear a mask when she is around anyone after she is tested.

## 2020-09-12 ENCOUNTER — Other Ambulatory Visit (HOSPITAL_COMMUNITY): Payer: BC Managed Care – PPO

## 2020-09-12 ENCOUNTER — Other Ambulatory Visit (HOSPITAL_COMMUNITY)
Admission: RE | Admit: 2020-09-12 | Discharge: 2020-09-12 | Disposition: A | Discharge status: Home / Self Care | Payer: BC Managed Care – PPO | Source: Ambulatory Visit | Attending: Emergency Medicine | Admitting: Emergency Medicine

## 2020-09-12 DIAGNOSIS — Z01812 Encounter for preprocedural laboratory examination: Secondary | ICD-10-CM | POA: Insufficient documentation

## 2020-09-12 DIAGNOSIS — R590 Localized enlarged lymph nodes: Secondary | ICD-10-CM | POA: Diagnosis present

## 2020-09-12 DIAGNOSIS — C7A00 Malignant carcinoid tumor of unspecified site: Secondary | ICD-10-CM | POA: Diagnosis not present

## 2020-09-12 DIAGNOSIS — C7B09 Secondary carcinoid tumors of other sites: Secondary | ICD-10-CM | POA: Diagnosis not present

## 2020-09-12 DIAGNOSIS — Z20822 Contact with and (suspected) exposure to covid-19: Secondary | ICD-10-CM | POA: Insufficient documentation

## 2020-09-12 DIAGNOSIS — F17210 Nicotine dependence, cigarettes, uncomplicated: Secondary | ICD-10-CM | POA: Diagnosis not present

## 2020-09-12 DIAGNOSIS — R918 Other nonspecific abnormal finding of lung field: Secondary | ICD-10-CM | POA: Diagnosis not present

## 2020-09-12 DIAGNOSIS — G35 Multiple sclerosis: Secondary | ICD-10-CM | POA: Diagnosis not present

## 2020-09-12 LAB — SARS CORONAVIRUS 2 (TAT 6-24 HRS): SARS Coronavirus 2: NEGATIVE

## 2020-09-15 ENCOUNTER — Ambulatory Visit (HOSPITAL_COMMUNITY): Payer: BC Managed Care – PPO

## 2020-09-15 ENCOUNTER — Ambulatory Visit (HOSPITAL_COMMUNITY): Payer: BC Managed Care – PPO | Admitting: Anesthesiology

## 2020-09-15 ENCOUNTER — Encounter (HOSPITAL_COMMUNITY): Payer: Self-pay | Admitting: Emergency Medicine

## 2020-09-15 ENCOUNTER — Ambulatory Visit (HOSPITAL_COMMUNITY)
Admission: RE | Admit: 2020-09-15 | Discharge: 2020-09-15 | Disposition: A | Discharge status: Home / Self Care | Payer: BC Managed Care – PPO | Attending: Emergency Medicine | Admitting: Emergency Medicine

## 2020-09-15 ENCOUNTER — Encounter (HOSPITAL_COMMUNITY)
Admission: RE | Disposition: A | Discharge status: Home / Self Care | Payer: Self-pay | Source: Home / Self Care | Attending: Emergency Medicine

## 2020-09-15 ENCOUNTER — Other Ambulatory Visit: Payer: Self-pay

## 2020-09-15 DIAGNOSIS — R590 Localized enlarged lymph nodes: Secondary | ICD-10-CM | POA: Diagnosis present

## 2020-09-15 DIAGNOSIS — R918 Other nonspecific abnormal finding of lung field: Secondary | ICD-10-CM

## 2020-09-15 DIAGNOSIS — F17210 Nicotine dependence, cigarettes, uncomplicated: Secondary | ICD-10-CM | POA: Insufficient documentation

## 2020-09-15 DIAGNOSIS — C7B09 Secondary carcinoid tumors of other sites: Secondary | ICD-10-CM | POA: Diagnosis not present

## 2020-09-15 DIAGNOSIS — C7A00 Malignant carcinoid tumor of unspecified site: Secondary | ICD-10-CM | POA: Insufficient documentation

## 2020-09-15 DIAGNOSIS — Z419 Encounter for procedure for purposes other than remedying health state, unspecified: Secondary | ICD-10-CM

## 2020-09-15 DIAGNOSIS — G35 Multiple sclerosis: Secondary | ICD-10-CM | POA: Insufficient documentation

## 2020-09-15 DIAGNOSIS — Z20822 Contact with and (suspected) exposure to covid-19: Secondary | ICD-10-CM | POA: Insufficient documentation

## 2020-09-15 DIAGNOSIS — I509 Heart failure, unspecified: Secondary | ICD-10-CM

## 2020-09-15 HISTORY — PX: VIDEO BRONCHOSCOPY WITH ENDOBRONCHIAL ULTRASOUND: SHX6177

## 2020-09-15 HISTORY — PX: BRONCHIAL WASHINGS: SHX5105

## 2020-09-15 HISTORY — PX: BRONCHIAL BIOPSY: SHX5109

## 2020-09-15 HISTORY — PX: BRONCHIAL NEEDLE ASPIRATION BIOPSY: SHX5106

## 2020-09-15 HISTORY — PX: VIDEO BRONCHOSCOPY WITH ENDOBRONCHIAL NAVIGATION: SHX6175

## 2020-09-15 HISTORY — PX: BRONCHIAL BRUSHINGS: SHX5108

## 2020-09-15 LAB — BASIC METABOLIC PANEL
Anion gap: 7 (ref 5–15)
BUN: 12 mg/dL (ref 6–20)
CO2: 22 mmol/L (ref 22–32)
Calcium: 8.8 mg/dL — ABNORMAL LOW (ref 8.9–10.3)
Chloride: 108 mmol/L (ref 98–111)
Creatinine, Ser: 0.69 mg/dL (ref 0.44–1.00)
GFR, Estimated: >60 mL/min (ref >60)
Glucose, Bld: 99 mg/dL (ref 70–99)
Potassium: 3.9 mmol/L (ref 3.5–5.1)
Sodium: 137 mmol/L (ref 135–145)

## 2020-09-15 LAB — POCT PREGNANCY, URINE: Preg Test, Ur: NEGATIVE

## 2020-09-15 IMAGING — DX DG CHEST 1V PORT
1 series · 1 of 1 positions shown · non-contrast
Comparison: CT chest dated [DATE].

CLINICAL DATA: Post bronchoscopy.

EXAM:
PORTABLE CHEST 1 VIEW

[chest ap]
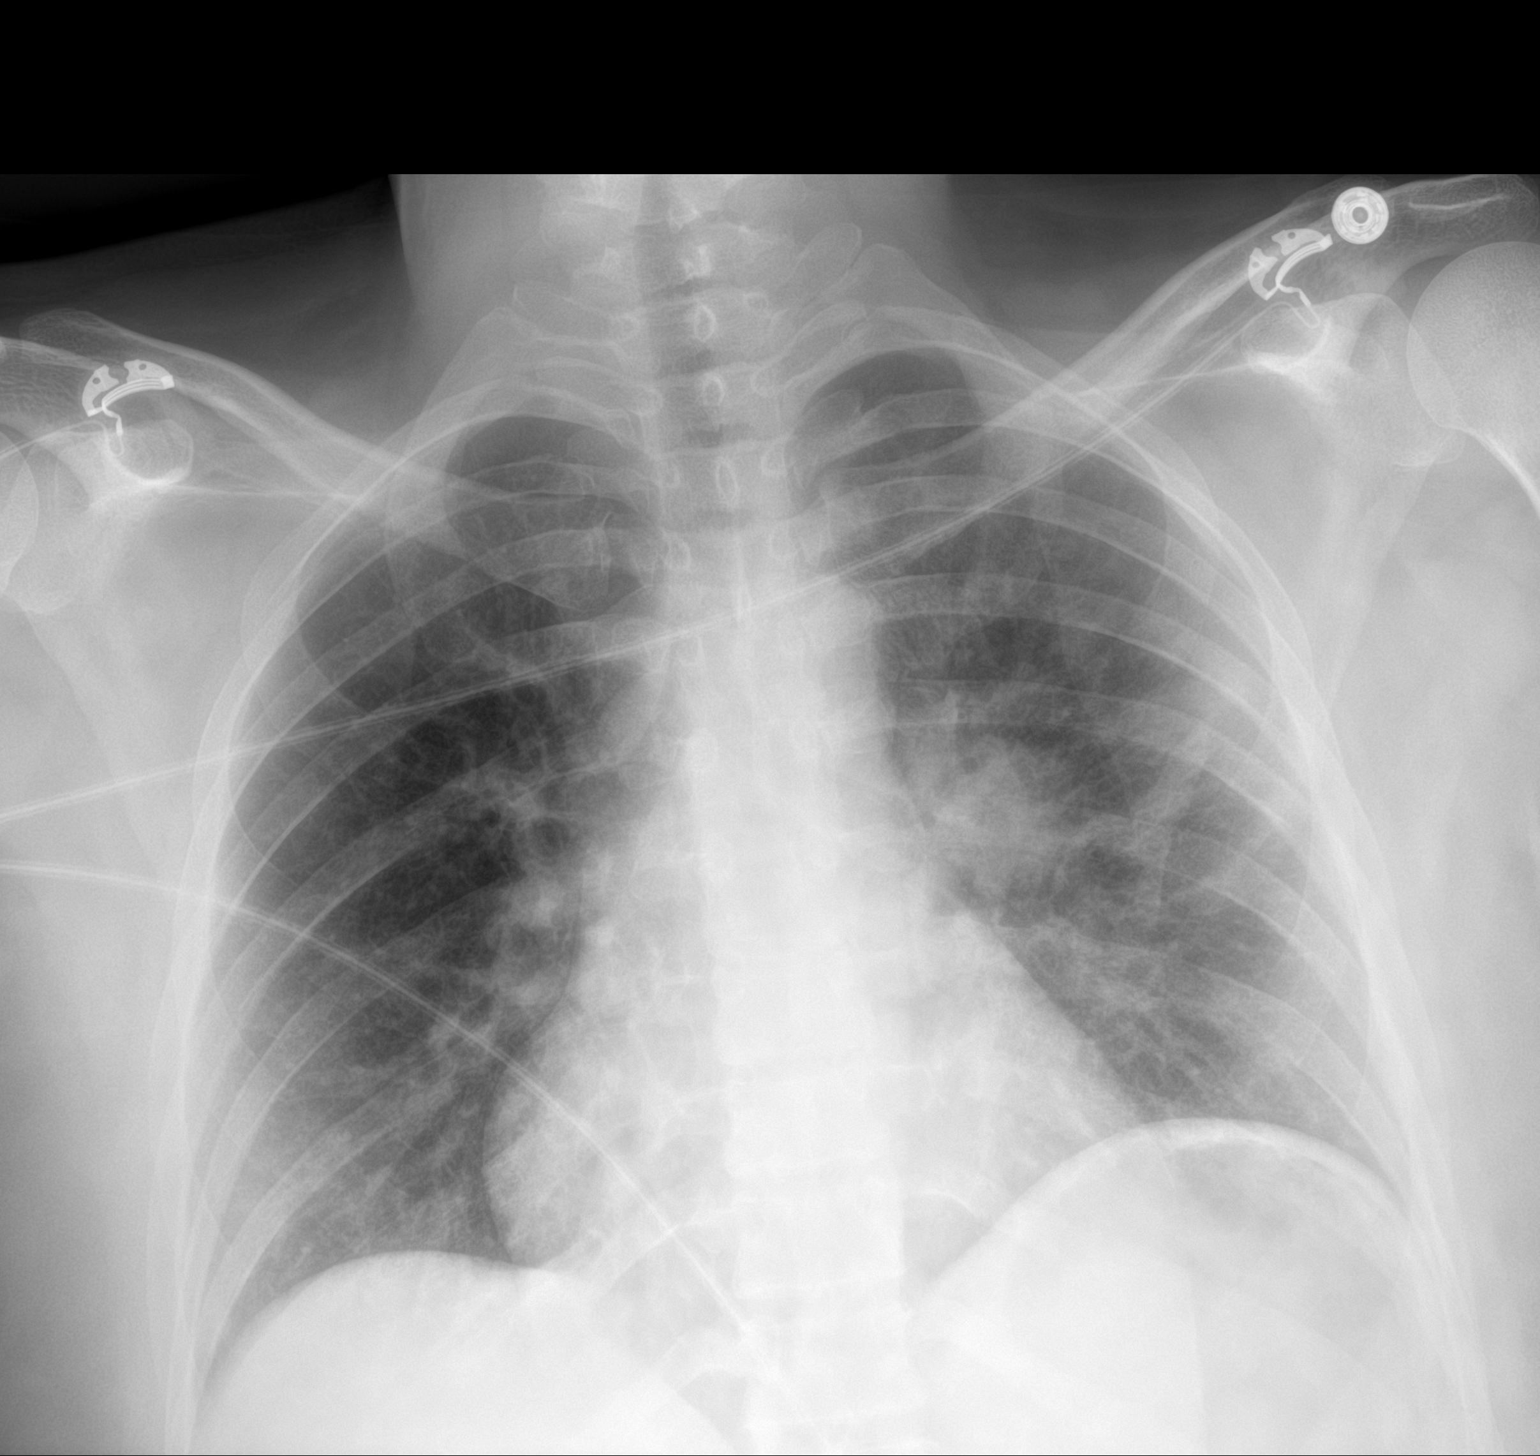

[1 of 1 positions shown; findings below may reference images not displayed]

FINDINGS: Normal heart size. Unchanged fullness of the left hilum related to
underlying lymphadenopathy. Left upper lobe lung nodules better
evaluated on CT. No focal consolidation, pleural effusion, or
pneumothorax. No acute osseous abnormality.
IMPRESSION: 1. No pneumothorax post bronchoscopy.

## 2020-09-15 SURGERY — VIDEO BRONCHOSCOPY WITH ENDOBRONCHIAL NAVIGATION
Anesthesia: General | Laterality: Left

## 2020-09-15 MED ORDER — PHENYLEPHRINE 40 MCG/ML (10ML) SYRINGE FOR IV PUSH (FOR BLOOD PRESSURE SUPPORT)
PREFILLED_SYRINGE | INTRAVENOUS | Status: DC | PRN
  Administered 2020-09-15: 80 ug via INTRAVENOUS

## 2020-09-15 MED ORDER — DEXAMETHASONE SODIUM PHOSPHATE 10 MG/ML IJ SOLN
INTRAMUSCULAR | Status: DC | PRN
  Administered 2020-09-15: 10 mg via INTRAVENOUS

## 2020-09-15 MED ORDER — MIDAZOLAM HCL 2 MG/2ML IJ SOLN
INTRAMUSCULAR | Status: DC | PRN
  Administered 2020-09-15: 2 mg via INTRAVENOUS

## 2020-09-15 MED ORDER — ROCURONIUM BROMIDE 10 MG/ML (PF) SYRINGE
PREFILLED_SYRINGE | INTRAVENOUS | Status: DC | PRN
  Administered 2020-09-15: 60 mg via INTRAVENOUS
  Administered 2020-09-15: 15 mg via INTRAVENOUS

## 2020-09-15 MED ORDER — MIDAZOLAM HCL 2 MG/2ML IJ SOLN
INTRAMUSCULAR | Status: AC
  Filled 2020-09-15: qty 2

## 2020-09-15 MED ORDER — LACTATED RINGERS IV SOLN: INTRAVENOUS | Status: DC

## 2020-09-15 MED ORDER — CHLORHEXIDINE GLUCONATE 0.12 % MT SOLN
OROMUCOSAL | Status: AC
  Administered 2020-09-15: 15 mL
  Filled 2020-09-15: qty 15

## 2020-09-15 MED ORDER — FENTANYL CITRATE (PF) 100 MCG/2ML IJ SOLN
INTRAMUSCULAR | Status: AC
  Filled 2020-09-15: qty 2

## 2020-09-15 MED ORDER — LIDOCAINE 2% (20 MG/ML) 5 ML SYRINGE
INTRAMUSCULAR | Status: DC | PRN
  Administered 2020-09-15: 40 mg via INTRAVENOUS

## 2020-09-15 MED ORDER — LACTATED RINGERS IV SOLN: INTRAVENOUS | Status: DC | PRN

## 2020-09-15 MED ORDER — ONDANSETRON HCL 4 MG/2ML IJ SOLN
INTRAMUSCULAR | Status: DC | PRN
  Administered 2020-09-15: 4 mg via INTRAVENOUS

## 2020-09-15 MED ORDER — FENTANYL CITRATE (PF) 250 MCG/5ML IJ SOLN
INTRAMUSCULAR | Status: DC | PRN
  Administered 2020-09-15 (×2): 50 ug via INTRAVENOUS

## 2020-09-15 MED ORDER — SUGAMMADEX SODIUM 200 MG/2ML IV SOLN
INTRAVENOUS | Status: DC | PRN
  Administered 2020-09-15: 200 mg via INTRAVENOUS

## 2020-09-15 MED ORDER — PROPOFOL 10 MG/ML IV BOLUS
INTRAVENOUS | Status: DC | PRN
  Administered 2020-09-15: 150 mg via INTRAVENOUS
  Administered 2020-09-15: 30 mg via INTRAVENOUS

## 2020-09-15 NOTE — Interval H&P Note (Signed)
History and Physical Interval Note:  09/15/2020 7:13 AM  Donna Higgins  has presented today for surgery, with the diagnosis of MEDIASTINAL ADENOPATHY, LEFT PULMONARY NODULE.  The various methods of treatment have been discussed with the patient and family. After consideration of risks, benefits and other options for treatment, the patient has consented to  Procedure(s): VIDEO BRONCHOSCOPY WITH ENDOBRONCHIAL NAVIGATION (Left) VIDEO BRONCHOSCOPY WITH ENDOBRONCHIAL ULTRASOUND (Left) as a surgical intervention.  The patient's history has been reviewed, patient examined, no change in status, stable for surgery.  I have reviewed the patient's chart and labs.  Questions were answered to the patient's satisfaction.     Collene Gobble

## 2020-09-15 NOTE — Discharge Instructions (Signed)
Flexible Bronchoscopy, Care After This sheet gives you information about how to care for yourself after your test. Your doctor may also give you more specific instructions. If you have problems or questions, contact your doctor. Follow these instructions at home: Eating and drinking  Do not eat or drink anything (not even water) for 2 hours after your test, or until your numbing medicine (local anesthetic) wears off.  When your numbness is gone and your cough and gag reflexes have come back, you may: ? Eat only soft foods. ? Slowly drink liquids.  The day after the test, go back to your normal diet. Driving  Do not drive for 24 hours if you were given a medicine to help you relax (sedative).  Do not drive or use heavy machinery while taking prescription pain medicine. General instructions   Take over-the-counter and prescription medicines only as told by your doctor.  Return to your normal activities as told. Ask what activities are safe for you.  Do not use any products that have nicotine or tobacco in them. This includes cigarettes and e-cigarettes. If you need help quitting, ask your doctor.  Keep all follow-up visits as told by your doctor. This is important. It is very important if you had a tissue sample (biopsy) taken. Get help right away if:  You have shortness of breath that gets worse.  You get light-headed.  You feel like you are going to pass out (faint).  You have chest pain.  You cough up: ? More than a little blood. ? More blood than before. Summary  Do not eat or drink anything (not even water) for 2 hours after your test, or until your numbing medicine wears off.  Do not use cigarettes. Do not use e-cigarettes.  Get help right away if you have chest pain.  Please call our office for any questions or concerns.  [phone].    This information is not intended to replace advice given to you by your health care provider. Make sure you discuss any  questions you have with your health care provider. Document Released: 01/24/2009 Document Revised: 03/11/2017 Document Reviewed: 04/16/2016 Elsevier Patient Education  2020 [employer].

## 2020-09-15 NOTE — Progress Notes (Signed)
Patient to follow up with pulmonaology

## 2020-09-15 NOTE — Anesthesia Postprocedure Evaluation (Signed)
Anesthesia Post Note  Patient: Donna Higgins  Procedure(s) Performed: VIDEO BRONCHOSCOPY WITH ENDOBRONCHIAL NAVIGATION (Left ) VIDEO BRONCHOSCOPY WITH ENDOBRONCHIAL ULTRASOUND (Left ) BRONCHIAL BRUSHINGS BRONCHIAL NEEDLE ASPIRATION BIOPSIES BRONCHIAL BIOPSIES BRONCHIAL WASHINGS     Patient location during evaluation: Cath Lab Anesthesia Type: General Level of consciousness: awake and alert Pain management: pain level controlled Vital Signs Assessment: post-procedure vital signs reviewed and stable Respiratory status: spontaneous breathing, nonlabored ventilation, respiratory function stable and patient connected to nasal cannula oxygen Cardiovascular status: blood pressure returned to baseline and stable Postop Assessment: no apparent nausea or vomiting Anesthetic complications: no   No complications documented.  Last Vitals:  Vitals:   09/15/20 0935 09/15/20 1003  BP: (!) 107/39 106/80  Pulse: 96 89  Resp: (!) 21 15  Temp:    SpO2: 95% 97%    Last Pain:  Vitals:   09/15/20 1003  TempSrc:   PainSc: 0-No pain                 Shantel Helwig COKER

## 2020-09-15 NOTE — Op Note (Signed)
Video Bronchoscopy with Endobronchial Ultrasound and Electromagnetic Navigation Procedure Note  Date of Operation: 09/15/2020  Pre-op Diagnosis: Mediastinal and hilar lymphadenopathy, pulmonary nodules  Post-op Diagnosis: Same  Surgeon: Baltazar Apo  Assistants: None  Anesthesia: General endotracheal anesthesia  Operation: Flexible video fiberoptic bronchoscopy with endobronchial ultrasound, electromagnetic navigation and biopsies.  Estimated Blood Loss: Minimal  Complications: None apparent  Indications and History: Donna Higgins is a 40 y.o. female with past medical history of recently diagnosed multiple sclerosis.  She was found to have mediastinal, hilar and possibly parenchymal lymphadenopathy on CT scan of the chest.  This was hypermetabolic on PET scan.  Recommendation was made to achieve a tissue diagnosis using endobronchial ultrasound and navigational bronchoscopy. The risks, benefits, complications, treatment options and expected outcomes were discussed with the patient.  The possibilities of pneumothorax, pneumonia, reaction to medication, pulmonary aspiration, perforation of a viscus, bleeding, failure to diagnose a condition and creating a complication requiring transfusion or operation were discussed with the patient who freely signed the consent.    Description of Procedure: The patient was examined in the preoperative area and history and data from the preprocedure consultation were reviewed. It was deemed appropriate to proceed.  The patient was taken to [facility] endoscopy room 2, identified as Donna Higgins and the procedure verified as Flexible Video Fiberoptic Bronchoscopy.  A Time Out was held and the above information confirmed. After being taken to the operating room general anesthesia was initiated and the patient  was orally intubated. The video fiberoptic bronchoscope was introduced via the endotracheal tube and a general inspection was performed which showed normal  airways on the right.  The lingular airways were slightly narrowed and there was a more proximal left upper lobe bronchial area of slightly raised mucosa with hypopigmentation.  Endobronchial brushings were performed in this area of the left upper lobe bronchus.    Attention was then turned to the patient's peripheral lesions. Prior to the date of the procedure a high-resolution CT scan of the chest was performed. Utilizing [place] a virtual tracheobronchial tree was generated to allow the creation of distinct navigation pathways to the patient's parenchymal abnormalities. An extendable working channel and locator guide were introduced into the bronchoscope. The distinct navigation pathways prepared prior to this procedure were then utilized to navigate to within 0.2 cm of patient's 2 lingular pulmonary nodules (target 1, target 2) identified on CT scan. The extendable working channel was secured into place at each location and the locator guide was withdrawn. Under fluoroscopic guidance transbronchial brushings were performed at target 1 for cytology.  Then after re-navigation transbronchial needle brushings, transbronchial Wang needle biopsies, and transbronchial forceps biopsies were performed at target 2 in the lingula to be sent for cytology and pathology. A bronchioalveolar lavage was performed in the left upper lobe and sent for microbiology (bacterial, fungal, AFB smears and cultures).   The standard scope was then withdrawn and the endobronchial ultrasound was used to identify and characterize the peritracheal, hilar and bronchial lymph nodes. Inspection showed significant enlargement at station 10 L, 4L, 7. Using real-time ultrasound guidance Wang needle biopsies were take from Station 10 L, 4L, 7 nodes and were sent for cytology and flow cytometry.  At the end of the procedure a general airway inspection was performed and there was no evidence of active bleeding. The bronchoscope was  removed.  The patient tolerated the procedure well. There was no significant blood loss and there were no obvious complications. A  post-procedural chest x-ray is pending.    Samples: 1. Wang needle biopsies from 10 L node 2. Wang needle biopsies from 4L node 3. Wang needle biopsies from 7 node 4. Transbronchial brushings from lingular nodule (target 1) 5. Transbronchial needle brushings from lingular nodule (target 2) 6. Transbronchial Wang needle biopsies from lingular nodule (target 2) 7. Transbronchial forceps biopsies from lingular nodule (target 2) 8. Bronchoalveolar lavage from left upper lobe 9. Endobronchial brushings from left upper lobe bronchus  Plans:  The patient will be discharged from the PACU to home when recovered from anesthesia. We will review the cytology, pathology and microbiology results with the patient when they become available. Outpatient followup will be with Dr Lamonte Sakai.    Baltazar Apo, MD, PhD 09/15/2020, 9:17 AM [facility] [phone] or if no answer before 7:00PM call [phone] For any issues after 7:00PM please call eLink [phone]

## 2020-09-15 NOTE — Anesthesia Preprocedure Evaluation (Signed)
Anesthesia Evaluation  Patient identified by MRN, date of birth, ID band Patient awake    Reviewed: Allergy & Precautions, NPO status , Patient's Chart, lab work & pertinent test results  Airway Mallampati: II  TM Distance: >3 FB Neck ROM: Full    Dental  (+) Teeth Intact, Dental Advisory Given   Pulmonary Current Smoker and Patient abstained from smoking.,    breath sounds clear to auscultation       Cardiovascular  Rhythm:Regular Rate:Normal     Neuro/Psych    GI/Hepatic   Endo/Other    Renal/GU      Musculoskeletal   Abdominal   Peds  Hematology   Anesthesia Other Findings   Reproductive/Obstetrics                             Anesthesia Physical Anesthesia Plan  ASA: III  Anesthesia Plan: General   Post-op Pain Management:    Induction: Intravenous  PONV Risk Score and Plan: Ondansetron and Dexamethasone  Airway Management Planned: Oral ETT  Additional Equipment:   Intra-op Plan:   Post-operative Plan: Extubation in OR  Informed Consent: I have reviewed the patients History and Physical, chart, labs and discussed the procedure including the risks, benefits and alternatives for the proposed anesthesia with the patient or authorized representative who has indicated his/her understanding and acceptance.     Dental advisory given  Plan Discussed with: CRNA and Anesthesiologist  Anesthesia Plan Comments:         Anesthesia Quick Evaluation

## 2020-09-15 NOTE — Transfer of Care (Signed)
Immediate Anesthesia Transfer of Care Note  Patient: Donna Higgins  Procedure(s) Performed: VIDEO BRONCHOSCOPY WITH ENDOBRONCHIAL NAVIGATION (Left ) VIDEO BRONCHOSCOPY WITH ENDOBRONCHIAL ULTRASOUND (Left ) BRONCHIAL BRUSHINGS BRONCHIAL NEEDLE ASPIRATION BIOPSIES BRONCHIAL BIOPSIES BRONCHIAL WASHINGS  Patient Location: Endoscopy Unit  Anesthesia Type:General  Level of Consciousness: awake, alert  and patient cooperative  Airway & Oxygen Therapy: Patient Spontanous Breathing and Patient connected to face mask oxygen  Post-op Assessment: Report given to RN and Post -op Vital signs reviewed and stable  Post vital signs: stable  Last Vitals:  Vitals Value Taken Time  BP 111/88 09/15/20 0924  Temp    Pulse 96 09/15/20 0926  Resp 22 09/15/20 0926  SpO2 97 % 09/15/20 0926  Vitals shown include unvalidated device data.  Last Pain:  Vitals:   09/15/20 0608  PainSc: 0-No pain         Complications: No complications documented.

## 2020-09-15 NOTE — Anesthesia Procedure Notes (Signed)
Procedure Name: Intubation Date/Time: 09/15/2020 7:46 AM Performed by: Inda Coke, CRNA Pre-anesthesia Checklist: Patient identified, Emergency Drugs available, Suction available and Patient being monitored Patient Re-evaluated:Patient Re-evaluated prior to induction Oxygen Delivery Method: Circle System Utilized Preoxygenation: Pre-oxygenation with 100% oxygen Induction Type: IV induction Ventilation: Mask ventilation without difficulty and Oral airway inserted - appropriate to patient size Laryngoscope Size: Glidescope and 3 Tube type: Oral Tube size: 8.5 mm Number of attempts: 1 Airway Equipment and Method: Stylet and Oral airway Placement Confirmation: ETT inserted through vocal cords under direct vision,  positive ETCO2 and breath sounds checked- equal and bilateral Secured at: 22 cm Tube secured with: Tape Dental Injury: Teeth and Oropharynx as per pre-operative assessment  Difficulty Due To: Difficult Airway- due to anterior larynx Comments: DL x 1 with MAC 3 blade with grade III view and unsuccessful intubation, Dr. Linna Caprice DL x 2 with Sabra Heck 2 blade with grade III view and unsuccessful intubation, DL x 3 with glidescope 3 blade with grade I view and successful intubation with some struggle and rotating of ETT

## 2020-09-16 LAB — ACID FAST SMEAR (AFB, MYCOBACTERIA): Acid Fast Smear: NEGATIVE

## 2020-09-17 ENCOUNTER — Encounter (HOSPITAL_COMMUNITY): Payer: Self-pay | Admitting: Emergency Medicine

## 2020-09-17 LAB — CULTURE, RESPIRATORY W GRAM STAIN: Culture: NORMAL

## 2020-09-19 ENCOUNTER — Telehealth: Payer: Self-pay | Admitting: Emergency Medicine

## 2020-09-19 DIAGNOSIS — D3A8 Other benign neuroendocrine tumors: Secondary | ICD-10-CM

## 2020-09-19 DIAGNOSIS — R590 Localized enlarged lymph nodes: Secondary | ICD-10-CM

## 2020-09-19 NOTE — Telephone Encounter (Signed)
Spoke with the patient by phone to let her know that her culture data is negative so far.  All of her cytology, flow cytometry is still pending.  I will call her when we have this information available.

## 2020-09-20 LAB — AEROBIC/ANAEROBIC CULTURE W GRAM STAIN (SURGICAL/DEEP WOUND): Culture: NORMAL

## 2020-09-23 LAB — CYTOLOGY - NON PAP

## 2020-09-23 NOTE — Telephone Encounter (Signed)
Dr. Byrum, Please see patient message and advise.  Thank you. 

## 2020-09-23 NOTE — Telephone Encounter (Signed)
Reviewed pathology results with the patient by phone today.  Her lingular nodular biopsies as well as all of her EBUS samples on her lymphadenopathy show atypical carcinoid, well differentiated neuroendocrine tumor.  I discussed with her the findings, possible therapy which would include surgical resection of reachable disease.  There may be some role for adjuvant chemotherapy although there is debate about this in the literature.  I will need to discuss this with medical oncology.  For starters I think I will refer her to [facility], discuss her case in thoracic conference this week.  I would also like to discuss with her neurologist to ensure we do not believe there is any connection between this process and her MS, to determine how to proceed therapeutically for each issue.

## 2020-09-24 ENCOUNTER — Telehealth: Payer: Self-pay | Admitting: *Deleted

## 2020-09-24 DIAGNOSIS — R590 Localized enlarged lymph nodes: Secondary | ICD-10-CM

## 2020-09-24 NOTE — Addendum Note (Signed)
Addended by: Collene Gobble on: 09/24/2020 09:17 AM   Modules accepted: Orders

## 2020-09-24 NOTE — Telephone Encounter (Signed)
I received referral on Donna Higgins today.  I called her to schedule with [place] team next week 10/02/20.  She verbalized understanding of appt.

## 2020-09-24 NOTE — Telephone Encounter (Signed)
[place] Referral made.

## 2020-09-29 ENCOUNTER — Encounter: Payer: Self-pay | Admitting: Nurse Practitioner

## 2020-10-02 ENCOUNTER — Institutional Professional Consult (permissible substitution) (INDEPENDENT_AMBULATORY_CARE_PROVIDER_SITE_OTHER): Payer: BC Managed Care – PPO | Admitting: Thoracic Surgery (Cardiothoracic Vascular Surgery)

## 2020-10-02 ENCOUNTER — Ambulatory Visit
Admission: RE | Admit: 2020-10-02 | Discharge: 2020-10-02 | Disposition: A | Discharge status: Home / Self Care | Payer: Self-pay | Source: Ambulatory Visit | Attending: Urology | Admitting: Urology

## 2020-10-02 ENCOUNTER — Other Ambulatory Visit: Payer: Self-pay | Admitting: *Deleted

## 2020-10-02 ENCOUNTER — Encounter: Payer: Self-pay | Admitting: *Deleted

## 2020-10-02 ENCOUNTER — Encounter: Payer: Self-pay | Admitting: Thoracic Surgery (Cardiothoracic Vascular Surgery)

## 2020-10-02 ENCOUNTER — Other Ambulatory Visit: Payer: Self-pay

## 2020-10-02 ENCOUNTER — Inpatient Hospital Stay: Payer: BC Managed Care – PPO

## 2020-10-02 ENCOUNTER — Ambulatory Visit
Admission: RE | Admit: 2020-10-02 | Discharge: 2020-10-02 | Disposition: A | Discharge status: Home / Self Care | Payer: BC Managed Care – PPO | Source: Ambulatory Visit | Attending: Radiation Oncology | Admitting: Radiation Oncology

## 2020-10-02 ENCOUNTER — Encounter: Payer: Self-pay | Admitting: Internal Medicine

## 2020-10-02 ENCOUNTER — Inpatient Hospital Stay: Payer: BC Managed Care – PPO | Attending: Internal Medicine | Admitting: Internal Medicine

## 2020-10-02 VITALS — BP 117/77 | HR 95 | Temp 97.6°F | Resp 18 | Ht 64.0 in | Wt 164.2 lb

## 2020-10-02 VITALS — BP 117/77 | HR 95 | Temp 97.6°F | Resp 18 | Wt 164.2 lb

## 2020-10-02 DIAGNOSIS — C7A090 Malignant carcinoid tumor of the bronchus and lung: Secondary | ICD-10-CM | POA: Diagnosis present

## 2020-10-02 DIAGNOSIS — C7A8 Other malignant neuroendocrine tumors: Secondary | ICD-10-CM | POA: Diagnosis not present

## 2020-10-02 DIAGNOSIS — C3490 Malignant neoplasm of unspecified part of unspecified bronchus or lung: Secondary | ICD-10-CM

## 2020-10-02 DIAGNOSIS — R590 Localized enlarged lymph nodes: Secondary | ICD-10-CM

## 2020-10-02 LAB — CBC WITH DIFFERENTIAL (CANCER CENTER ONLY)
Abs Immature Granulocytes: 0.01 10*3/uL (ref 0.00–0.07)
Basophils Absolute: 0.1 10*3/uL (ref 0.0–0.1)
Basophils Relative: 1 %
Eosinophils Absolute: 0.3 10*3/uL (ref 0.0–0.5)
Eosinophils Relative: 4 %
HCT: 39.5 % (ref 36.0–46.0)
Hemoglobin: 13.1 g/dL (ref 12.0–15.0)
Immature Granulocytes: 0 %
Lymphocytes Relative: 29 %
Lymphs Abs: 2 10*3/uL (ref 0.7–4.0)
MCH: 29.9 pg (ref 26.0–34.0)
MCHC: 33.2 g/dL (ref 30.0–36.0)
MCV: 90.2 fL (ref 80.0–100.0)
Monocytes Absolute: 0.6 10*3/uL (ref 0.1–1.0)
Monocytes Relative: 9 %
Neutro Abs: 3.9 10*3/uL (ref 1.7–7.7)
Neutrophils Relative %: 57 %
Platelet Count: 288 10*3/uL (ref 150–400)
RBC: 4.38 MIL/uL (ref 3.87–5.11)
RDW: 12.4 % (ref 11.5–15.5)
WBC Count: 6.9 10*3/uL (ref 4.0–10.5)
nRBC: 0 % (ref 0.0–0.2)

## 2020-10-02 LAB — CMP (CANCER CENTER ONLY)
ALT: 17 U/L (ref 0–44)
AST: 14 U/L — ABNORMAL LOW (ref 15–41)
Albumin: 3.8 g/dL (ref 3.5–5.0)
Alkaline Phosphatase: 75 U/L (ref 38–126)
Anion gap: 9 (ref 5–15)
BUN: 16 mg/dL (ref 6–20)
CO2: 25 mmol/L (ref 22–32)
Calcium: 9.3 mg/dL (ref 8.9–10.3)
Chloride: 107 mmol/L (ref 98–111)
Creatinine: 0.75 mg/dL (ref 0.44–1.00)
GFR, Estimated: >60 mL/min (ref >60)
Glucose, Bld: 84 mg/dL (ref 70–99)
Potassium: 4 mmol/L (ref 3.5–5.1)
Sodium: 141 mmol/L (ref 135–145)
Total Bilirubin: 0.2 mg/dL — ABNORMAL LOW (ref 0.3–1.2)
Total Protein: 6.8 g/dL (ref 6.5–8.1)

## 2020-10-02 NOTE — Progress Notes (Signed)
Per Ashlyn, I requested radiology to push MRI scans on 4/18 and 4/20 to be pushed to PACS

## 2020-10-02 NOTE — Progress Notes (Signed)
[place] Telephone:([phone]) [phone]   Fax:([phone] Multidisciplinary thoracic oncology clinic CONSULT NOTE  REFERRING PHYSICIAN: Dr. Baltazar Apo  REASON FOR CONSULTATION:  40 years old white female recently diagnosed with lung cancer  HPI Donna Higgins is a 40 y.o. female with occasional smoking and history of MS which was newly diagnosed when she developed blurred vision and eye pressure.  She had MRI of the brain that confirmed hyperintense foci in the cerebral white matter.  During her evaluation, she had MRI of the thoracic spine on 07/30/2020 and it showed concerning for adenopathy or mass in the subcarinal mediastinum and at the left hilar region with possible opacity/lesion within the left lung field.  The patient was referred to Dr. Lamonte Sakai and CT scan of the chest on 08/15/2020 showed mass identified at the left major fissure measuring 1.7 x 0.9 cm.  There was additional nodule centrally in the lingula measuring 0.9 x 0.8 cm.  The scan also showed subcarinal adenopathy with a node measuring 2.3 cm, borderline enlarged with node adjacent to the left mainstem bronchus measuring 1.0 cm and a large left hilar node measuring 1.8 cm as well as 1.6 cm.  The patient had a PET scan on 09/01/2020 and it showed 2 adjacent hypermetabolic pulmonary nodules in the left upper lobe concerning for bronchogenic carcinoma.  There was hypermetabolic left hilar mass and subcarinal node most concerning for metastatic adenopathy with findings suspicious for small cell cancer.  There was no evidence of distant metastatic disease. The patient underwent video bronchoscopy with endobronchial ultrasound and electromagnetic navigation procedure under the care of Dr. Lamonte Sakai on 09/15/2020.The final pathology ([id number]) showed well differentiated neuroendocrine tumor. Immunohistochemical stains performed on 5G show that the tumor cells are positive for synaptophysin, CD56, AE1/AE3 and TTF-1.  Immunostains  for CD3, CD20, CD45, CK5/6, p63, PR and vimentin are negative.  The immunoprofile supports above interpretation.  Fragmented nature of the  specimen is suboptimal for evaluation of mitotic activity.  Immunostain for Ki-67 shows a proliferative index of about 10-12%, suggestive of an atypical carcinoid.  Dr. Lamonte Sakai kindly referred the patient to me today for evaluation and recommendation regarding treatment of her condition. When seen today the patient is feeling well with no concerning complaints except for visual changes with disturbance of the color contrast.  She also has mild cough.  She denied having any chest pain, shortness of breath or hemoptysis.  She denied having any weight loss or night sweats.  She has no nausea, vomiting, diarrhea or constipation.  She has occasional headache. Family history significant for mother with kidney stone, diabetes, hypertension and dyslipidemia.  2 maternal aunts had breast cancer and father died from [place]. The patient is married and has 2 children.  She works as an Chief Technology Officer.  She was accompanied by her husband Donna Higgins and her sister Donna Higgins was available by phone during the visit.  The patient has a history of smoking less than 1 pack/day for around 20 years and she currently smokes around 2 cigarettes every day.  She also drinks couple of alcoholic drinks on weekends.  No history of drug abuse.  HPI  Past Medical History:  Diagnosis Date   Decreased libido    History of chicken pox    Internal hemorrhoids without mention of complication    Neuromuscular disorder ([place])    Phreesia 08/05/2020, dx MS in April    Past Surgical History:  Procedure Laterality Date   BRONCHIAL BIOPSY  09/15/2020   Procedure: BRONCHIAL BIOPSIES;  Surgeon: Collene Gobble, MD;  Location: [facility] ENDOSCOPY;  Service: Pulmonary;;   BRONCHIAL BRUSHINGS  09/15/2020   Procedure: BRONCHIAL BRUSHINGS;  Surgeon: Collene Gobble, MD;  Location: [facility] ENDOSCOPY;  Service: Pulmonary;;    BRONCHIAL NEEDLE ASPIRATION BIOPSY  09/15/2020   Procedure: BRONCHIAL NEEDLE ASPIRATION BIOPSIES;  Surgeon: Collene Gobble, MD;  Location: [place];  Service: Pulmonary;;   BRONCHIAL WASHINGS  09/15/2020   Procedure: BRONCHIAL WASHINGS;  Surgeon: Collene Gobble, MD;  Location: [place];  Service: Pulmonary;;   CESAREAN SECTION  2009   VIDEO BRONCHOSCOPY WITH ENDOBRONCHIAL NAVIGATION Left 09/15/2020   Procedure: VIDEO BRONCHOSCOPY WITH ENDOBRONCHIAL NAVIGATION;  Surgeon: Collene Gobble, MD;  Location: [facility] ENDOSCOPY;  Service: Pulmonary;  Laterality: Left;   VIDEO BRONCHOSCOPY WITH ENDOBRONCHIAL ULTRASOUND Left 09/15/2020   Procedure: VIDEO BRONCHOSCOPY WITH ENDOBRONCHIAL ULTRASOUND;  Surgeon: Collene Gobble, MD;  Location: [facility] ENDOSCOPY;  Service: Pulmonary;  Laterality: Left;   WISDOM TOOTH EXTRACTION      Family History  Problem Relation Age of Onset   Urolithiasis Mother    Hypertension Mother    Hyperlipidemia Mother    Cancer Maternal Aunt        breast   Diabetes Maternal Grandmother     Social History Social History   Tobacco Use   Smoking status: Some Days    Pack years: 0.00   Smokeless tobacco: Never   Tobacco comments:    1-2 a day  Substance Use Topics   Alcohol use: Yes    Alcohol/week: 4.0 standard drinks    Types: 4 Cans of beer per week   Drug use: No    Allergies  Allergen Reactions   Stadol [Butorphanol Tartrate]     Legs twitching, nausea    Current Outpatient Medications  Medication Sig Dispense Refill   Beta Carotene (VITAMIN A) 25000 UNIT capsule Take 25,000 Units by mouth daily.     cholecalciferol (VITAMIN D3) 25 MCG (1000 UNIT) tablet Take 1,000 Units by mouth daily.     No current facility-administered medications for this visit.    Review of Systems  Constitutional: negative Eyes: positive for visual disturbance Ears, nose, mouth, throat, and face: negative Respiratory: positive for cough Cardiovascular:  negative Gastrointestinal: negative Genitourinary:negative Integument/breast: negative Hematologic/lymphatic: negative Musculoskeletal:negative Neurological: positive for headaches Behavioral/Psych: negative Endocrine: negative Allergic/Immunologic: negative  Physical Exam  VPX:TGGYI, healthy, no distress, well nourished, well developed, and anxious SKIN: skin color, texture, turgor are normal, no rashes or significant lesions HEAD: Normocephalic, No masses, lesions, tenderness or abnormalities EYES: normal, PERRLA, Conjunctiva are pink and non-injected EARS: External ears normal OROPHARYNX:no exudate, no erythema, and lips, buccal mucosa, and tongue normal  NECK: supple, no adenopathy, no JVD LYMPH:  no palpable lymphadenopathy, no hepatosplenomegaly BREAST:not examined LUNGS: clear to auscultation , and palpation HEART: regular rate & rhythm, no murmurs, and no gallops ABDOMEN:abdomen soft, non-tender, normal bowel sounds, and no masses or organomegaly BACK: No CVA tenderness, Range of motion is normal EXTREMITIES:no joint deformities, effusion, or inflammation, no edema  NEURO: alert & oriented x 3 with fluent speech, no focal motor/sensory deficits  PERFORMANCE STATUS: ECOG 1  LABORATORY DATA: Lab Results  Component Value Date   WBC 6.9 10/02/2020   HGB 13.1 10/02/2020   HCT 39.5 10/02/2020   MCV 90.2 10/02/2020   PLT 288 10/02/2020      Chemistry      Component Value Date/Time   NA 137 09/15/2020 0555  K 3.9 09/15/2020 0555   CL 108 09/15/2020 0555   CO2 22 09/15/2020 0555   BUN 12 09/15/2020 0555   CREATININE 0.69 09/15/2020 0555      Component Value Date/Time   CALCIUM 8.8 (L) 09/15/2020 0555   ALKPHOS 90 05/22/2012 1607   AST 18 05/22/2012 1607   ALT 16 05/22/2012 1607   BILITOT 0.5 05/22/2012 1607       RADIOGRAPHIC STUDIES: DG Chest Port 1 View  Result Date: 09/15/2020 CLINICAL DATA:  Post bronchoscopy. EXAM: PORTABLE CHEST 1 VIEW COMPARISON:   CT chest dated Aug 15, 2020. FINDINGS: Normal heart size. Unchanged fullness of the left hilum related to underlying lymphadenopathy. Left upper lobe lung nodules better evaluated on CT. No focal consolidation, pleural effusion, or pneumothorax. No acute osseous abnormality. IMPRESSION: 1. No pneumothorax post bronchoscopy. Electronically Signed   By: Titus Dubin M.D.   On: 09/15/2020 09:57   DG C-ARM BRONCHOSCOPY  Result Date: 09/15/2020 C-ARM BRONCHOSCOPY: Fluoroscopy was utilized by the requesting physician.  No radiographic interpretation.    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   PLAN: I had a lengthy discussion with the patient and her family today about her current disease stage, prognosis and treatment options. The patient was recently diagnosed with multiple sclerosis and the family have concern and question about the diagnosis especially with the recent diagnosis of lung cancer and they want to make sure that imaging studies of the brain are done to rule out any metastatic disease to the brain. I will arrange for the patient to have another MRI of the brain to rule out the possibility of metastatic deposit from her disease and the brain. I also discussed with the patient her treatment options and with the atypical carcinoid she may benefit from dotatate PET scan to rule out any other metastatic focus. If the patient has no additional metastatic disease besides the currently known lesion, she may benefit from maximum surgical debulking and if there is any residual disease, we may consider her for adjuvant radiotherapy or concurrent chemoradiation.  This according to the NCCN guideline for neuroendocrine tumor and also with personal consultation  with Dr. Lolita Lenz from [place]. The patient will see Dr. Kipp Brood later today for evaluation and discussion of the surgical option. She was also seen by Dr. Tammi Klippel from radiation oncology for discussion of the possible radiation in the future. I will see the patient back for follow-up visit in 2 weeks for evaluation after the dotatate PET scan. The patient and her family are in agreement with the current plan. I spent a lot of time explaining to the patient and her family the nature of the atypical carcinoid and the possible treatment options. For the smoking cessation, I strongly encouraged the patient to quit smoking. She was advised to call immediately if she has any other concerning symptoms in the interval. The patient voices understanding of current disease status and treatment options and is in agreement with the current care plan.  All questions were answered. The patient knows to call the clinic with any problems, questions or concerns. We can certainly see the patient much sooner if necessary.  Thank you so much for allowing me to participate in the care of  Donna Higgins. I will continue to follow up the patient with you and assist in her care.  The total time spent in the appointment was 90 minutes.  Disclaimer: This note was dictated with voice recognition software. Similar sounding words can inadvertently be transcribed and may not be corrected upon review.   Eilleen Kempf October 02, 2020, 1:49 PM

## 2020-10-02 NOTE — Progress Notes (Signed)
The proposed treatment discussed in cancer conference 10/02/20 is for discussion purpose only and is not a binding recommendation.  The patient was not physically examined nor present for their treatment options. Therefore, final treatment plans cannot be decided.

## 2020-10-02 NOTE — Progress Notes (Signed)
[address]       [place] [address]             [phone]                    Sheela P [place] Medical Record #[id number] Date of Birth: 1981/03/24  Referring: Collene Gobble, MD Primary Care: Ronnell Freshwater, NP Primary Cardiologist: None  Chief Complaint:   No chief complaint on file.   History of Present Illness:    Donna Higgins 40 y.o. female presents for surgical evaluation of a left upper lobe pulmonary nodules, left hilar and subcarinal lymphadenopathy.  She has a remote hx of smoking, and was recently diagnosed with multiple sclerosis.  During this evaluation she was noted to have a subcarinal mass on T-spine imaging.  PET/CT showed avidity in all of these areas.  She subsequently underwent a navigational bronchoscopy which identified neuroendocrine tumor in the lymph nodes and pulmonary nodule.  Immuno stains were concerning for atypical carcinoid.       Zubrod Score: At the time of surgery this patient's most appropriate activity status/level should be described as: [x]     0    Normal activity, no symptoms []     1    Restricted in physical strenuous activity but ambulatory, able to do out light work []     2    Ambulatory and capable of self care, unable to do work activities, up and about               >50 % of waking hours                              []     3    Only limited self care, in bed greater than 50% of waking hours []     4    Completely disabled, no self care, confined to bed or chair []     5    Moribund   Past Medical History:  Diagnosis Date   Decreased libido    History of chicken pox    Internal hemorrhoids without mention of complication    Neuromuscular disorder ([place])    Phreesia 08/05/2020, dx MS in April    Past Surgical History:  Procedure Laterality Date   BRONCHIAL BIOPSY  09/15/2020   Procedure: BRONCHIAL BIOPSIES;  Surgeon: Collene Gobble, MD;  Location: [place];  Service: Pulmonary;;   BRONCHIAL  BRUSHINGS  09/15/2020   Procedure: BRONCHIAL BRUSHINGS;  Surgeon: Collene Gobble, MD;  Location: [place];  Service: Pulmonary;;   BRONCHIAL NEEDLE ASPIRATION BIOPSY  09/15/2020   Procedure: BRONCHIAL NEEDLE ASPIRATION BIOPSIES;  Surgeon: Collene Gobble, MD;  Location: [place];  Service: Pulmonary;;   BRONCHIAL WASHINGS  09/15/2020   Procedure: BRONCHIAL WASHINGS;  Surgeon: Collene Gobble, MD;  Location: [place];  Service: Pulmonary;;   CESAREAN SECTION  2009   VIDEO BRONCHOSCOPY WITH ENDOBRONCHIAL NAVIGATION Left 09/15/2020   Procedure: VIDEO BRONCHOSCOPY WITH ENDOBRONCHIAL NAVIGATION;  Surgeon: Collene Gobble, MD;  Location: [facility] ENDOSCOPY;  Service: Pulmonary;  Laterality: Left;   VIDEO BRONCHOSCOPY WITH ENDOBRONCHIAL ULTRASOUND Left 09/15/2020   Procedure: VIDEO BRONCHOSCOPY WITH ENDOBRONCHIAL ULTRASOUND;  Surgeon: Collene Gobble, MD;  Location[facility] ENDOSCOPY;  Service: Pulmonary;  Laterality: Left;   WISDOM TOOTH EXTRACTION      Family History  Problem Relation Age of Onset  Urolithiasis Mother    Hypertension Mother    Hyperlipidemia Mother    Cancer Maternal Aunt        breast   Diabetes Maternal Grandmother      Social History   Tobacco Use  Smoking Status Some Days   Pack years: 0.00  Smokeless Tobacco Never  Tobacco Comments   1-2 a day    Social History   Substance and Sexual Activity  Alcohol Use Yes   Alcohol/week: 4.0 standard drinks   Types: 4 Cans of beer per week     Allergies  Allergen Reactions   Stadol [Butorphanol Tartrate]     Legs twitching, nausea    Current Outpatient Medications  Medication Sig Dispense Refill   Beta Carotene (VITAMIN A) 25000 UNIT capsule Take 25,000 Units by mouth daily.     cholecalciferol (VITAMIN D3) 25 MCG (1000 UNIT) tablet Take 1,000 Units by mouth daily.     No current facility-administered medications for this visit.    Review of Systems  Constitutional:  Negative for weight loss.  Respiratory:   Negative for cough and shortness of breath.   Musculoskeletal: Negative.   Neurological:  Positive for headaches.    PHYSICAL EXAMINATION: BP 117/77   Pulse 95   Temp 97.6 F (36.4 C)   Resp 18   Wt 164 lb 3.2 oz (74.5 kg)   LMP 09/09/2020   SpO2 100%   BMI 28.18 kg/m  Physical Exam Constitutional:      General: She is not in acute distress.    Appearance: Normal appearance. She is normal weight. She is not ill-appearing.  HENT:     Head: Normocephalic and atraumatic.  Eyes:     Extraocular Movements: Extraocular movements intact.  Cardiovascular:     Rate and Rhythm: Normal rate.  Pulmonary:     Effort: Pulmonary effort is normal. No respiratory distress.  Abdominal:     General: There is no distension.  Musculoskeletal:        General: Normal range of motion.     Cervical back: Normal range of motion.  Neurological:     Mental Status: She is alert and oriented to person, place, and time.    Diagnostic Studies & Laboratory data:     Recent Radiology Findings:   DG Chest Port 1 View  Result Date: 09/15/2020 CLINICAL DATA:  Post bronchoscopy. EXAM: PORTABLE CHEST 1 VIEW COMPARISON:  CT chest dated Aug 15, 2020. FINDINGS: Normal heart size. Unchanged fullness of the left hilum related to underlying lymphadenopathy. Left upper lobe lung nodules better evaluated on CT. No focal consolidation, pleural effusion, or pneumothorax. No acute osseous abnormality. IMPRESSION: 1. No pneumothorax post bronchoscopy. Electronically Signed   By: Titus Dubin M.D.   On: 09/15/2020 09:57   DG C-ARM BRONCHOSCOPY  Result Date: 09/15/2020 C-ARM BRONCHOSCOPY: Fluoroscopy was utilized by the requesting physician.  No radiographic interpretation.       I have independently reviewed the above radiology studies  and reviewed the findings with the patient.   Recent Lab Findings: Lab Results  Component Value Date   WBC 6.9 10/02/2020   HGB 13.1 10/02/2020   HCT 39.5 10/02/2020   PLT 288  10/02/2020   GLUCOSE 84 10/02/2020   CHOL 176 05/22/2012   TRIG 69.0 05/22/2012   HDL 52.70 05/22/2012   LDLCALC 110 (H) 05/22/2012   ALT 17 10/02/2020   AST 14 (L) 10/02/2020   NA 141 10/02/2020   K 4.0 10/02/2020  CL 107 10/02/2020   CREATININE 0.75 10/02/2020   BUN 16 10/02/2020   CO2 25 10/02/2020   TSH 0.83 05/22/2012   INR 1.0 08/26/2020     PFTs: Pending   Problem List: Atypical left upper lobe X 2.  56mm with SUV of 4, 41mm with SUV 3.3 Left hilar adenopathy with SUV of 9.3 Subcarinal lymphadenopathy with SUV of 10.1 Diagnosis of multiple sclerosis   Assessment / Plan:   40 yo female with with atypical carcinoid involving the left upper lobe, left hilum, and subcarinal lymph nodes.  She also has been recently diagnosed with multiple sclerosis and has evidence of lesions in her brain and thoracic spine.  I explained to her and her husband that this is concerning for potential metastatic disease, as it would be unlikely that she has two rare and separated diagnosis at the same time.  I also explained that despite having stains consistent with carcinoid, there is the possibility that this may also be small cell lung cancer if these lesions prove to be metastatic.    She is scheduled to undergo a Dotatate PET CT to evaluate for any further metastatic disease.  The MRI brain will also be repeated for interval comparison.  If she does not have any distant disease, then we will consider surgical debulking.  It will be challenging to remove all the subcarinal nodes, given that they are more on the right.  I discussed the option for a L RATS, left upper lobectomy, followed by a R RATS lymph node resection.  R0 resection is unlikely, thus patient will likely need radiation following surgery  I will follow-up with her after the scans have been completed.     I  spent 40 minutes with  the patient face to face in counseling and coordination of care.    Lajuana Matte 10/02/2020  4:35 PM

## 2020-10-02 NOTE — Progress Notes (Signed)
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    ICD-10-CM   1. Hilar lymphadenopathy  R59.0     2. Neuroendocrine carcinoma of lung (HCC)  C7A.8       HISTORY OF PRESENT ILLNESS: Donna Higgins is a 40 y.o. female seen at the request of Dr. Lamonte Sakai.  She initially presented to her ophthalmologist on 07/28/2020 with complaints of 3 days of worsening vision in her right eye. Initially her vision was blurry and became very blurry just in the central field of view. No nausea or vomiting. No headache. No focal deficits. Otherwise in her normal state of health.  Her ophthalmologist recommended that she proceed to the emergency department for further work-up and management of suspected optic neuritis.  MRI head and MRI orbits were performed at that time showing several foci of high signal bilaterally, concerning for a demyelinating process, likely MS so she was admitted and treated with IV steroids.  MRI of the cervical and thoracic spine were performed on 07/30/2020 and showed high signal lesions at C2, C5 and T11, concerning for demyelinating process.  Additionally, on thoracic MRI, there was adenopathy versus mass in the subcarinal mediastinum and left hilar region with possible lesion in the left lung.  The recommendation was for further evaluation with a CT chest but this was not performed during her admission.  At the time of her follow-up visit with her PCP, this was ordered and performed the same day and confirmed 2 nodules in the left lung measuring 1.7 cm at the left major fissure and 9 mm in the lingula as  well as left mediastinal/hilar lymphadenopathy.  She was referred to Dr. Lamonte Sakai in pulmonology on 08/26/2020 and he recommended further evaluation with PET imaging.  The PET scan was performed on 09/01/2020 confirming 2 adjacent hypermetabolic nodules in the left upper lobe measuring 15 mm and 8 mm as well as a 2.9 cm hypermetabolic left hilar node/nodule and a 2.3 cm hypermetabolic subcarinal node.  There was no evidence of osseous metastatic disease.  She underwent bronchoscopy with EBUS on 09/15/2020 with biopsy of the left upper lobe lesions and 10 L, 4L and station 7 nodes.  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  The patient was referred today for presentation in the multidisciplinary thoracic oncology conference. Radiology studies and pathology slides were presented there for review and discussion of treatment options. A consensus was discussed regarding potential next steps.  PREVIOUS RADIATION THERAPY: No  PAST MEDICAL HISTORY:  Past Medical History:  Diagnosis Date   Decreased libido    History of chicken pox    Internal hemorrhoids without mention of complication    Neuromuscular disorder ([place])    Phreesia 08/05/2020, dx MS in April      PAST SURGICAL HISTORY: Past Surgical History:  Procedure Laterality Date   BRONCHIAL BIOPSY  09/15/2020   Procedure: BRONCHIAL BIOPSIES;  Surgeon: Collene Gobble, MD;  Location: [facility] ENDOSCOPY;  Service: Pulmonary;;   BRONCHIAL BRUSHINGS  09/15/2020   Procedure: BRONCHIAL BRUSHINGS;  Surgeon: Collene Gobble, MD;  Location: [facility] ENDOSCOPY;  Service: Pulmonary;;   BRONCHIAL NEEDLE  ASPIRATION BIOPSY  09/15/2020   Procedure: BRONCHIAL NEEDLE ASPIRATION BIOPSIES;  Surgeon: Collene Gobble, MD;  Location: [place];  Service: Pulmonary;;   BRONCHIAL WASHINGS  09/15/2020   Procedure: BRONCHIAL WASHINGS;  Surgeon: Collene Gobble, MD;  Location: [facility] ENDOSCOPY;  Service: Pulmonary;;   CESAREAN SECTION  2009   VIDEO  BRONCHOSCOPY WITH ENDOBRONCHIAL NAVIGATION Left 09/15/2020   Procedure: VIDEO BRONCHOSCOPY WITH ENDOBRONCHIAL NAVIGATION;  Surgeon: Collene Gobble, MD;  Location: [facility] ENDOSCOPY;  Service: Pulmonary;  Laterality: Left;   VIDEO BRONCHOSCOPY WITH ENDOBRONCHIAL ULTRASOUND Left 09/15/2020   Procedure: VIDEO BRONCHOSCOPY WITH ENDOBRONCHIAL ULTRASOUND;  Surgeon: Collene Gobble, MD;  Location: [facility] ENDOSCOPY;  Service: Pulmonary;  Laterality: Left;   WISDOM TOOTH EXTRACTION      FAMILY HISTORY:  Family History  Problem Relation Age of Onset   Urolithiasis Mother    Hypertension Mother    Hyperlipidemia Mother    Cancer Maternal Aunt        breast   Diabetes Maternal Grandmother     SOCIAL HISTORY:  Social History   Socioeconomic History   Marital status: Married    Spouse name: Not on file   Number of children: Not on file   Years of education: Not on file   Highest education level: Not on file  Occupational History   Not on file  Tobacco Use   Smoking status: Some Days    Pack years: 0.00   Smokeless tobacco: Never   Tobacco comments:    1-2 a day  Substance and Sexual Activity   Alcohol use: Yes    Alcohol/week: 4.0 standard drinks    Types: 4 Cans of beer per week   Drug use: No   Sexual activity: Yes    Comment: Husband vasectomy  Other Topics Concern   Not on file  Social History Narrative   Right handed   Social Determinants of Health   Financial Resource Strain: Not on file  Food Insecurity: Not on file  Transportation Needs: Not on file  Physical Activity: Not on file  Stress: Not on file  Social Connections: Not on file  Intimate Partner Violence: Not on file    ALLERGIES: Stadol [butorphanol tartrate]  MEDICATIONS:  Current Outpatient Medications  Medication Sig Dispense Refill   Beta Carotene (VITAMIN A) 25000 UNIT capsule Take 25,000 Units by mouth daily.     cholecalciferol (VITAMIN D3) 25 MCG (1000 UNIT) tablet Take 1,000 Units by mouth daily.     No  current facility-administered medications for this encounter.    REVIEW OF SYSTEMS:  On review of systems, the patient reports that she is doing well overall. She denies any chest pain, shortness of breath, cough, fevers, chills, night sweats, unintended weight changes. She denies any bowel or bladder disturbances, and denies abdominal pain, nausea or vomiting. She denies any new musculoskeletal or joint aches or pains. A complete review of systems is obtained and is otherwise negative.  PHYSICAL EXAM:  Wt Readings from Last 3 Encounters:  10/02/20 164 lb 3.2 oz (74.5 kg)  10/02/20 164 lb 3.2 oz (74.5 kg)  10/02/20 164 lb 3.2 oz (74.5 kg)   Temp Readings from Last 3 Encounters:  10/02/20 97.6 F (36.4 C)  10/02/20 97.6 F (36.4 C) (Tympanic)  10/02/20 97.6 F (36.4 C)   BP Readings from Last 3 Encounters:  10/02/20 117/77  10/02/20 117/77  10/02/20 117/77   Pulse Readings from Last 3 Encounters:  10/02/20 95  10/02/20 95  10/02/20 95    /10  In general this is a well appearing Caucasian female in no acute distress.  She is alert and oriented x4 and appropriate throughout the examination. HEENT reveals that the patient is normocephalic, atraumatic. EOMs are intact. PERRLA. Skin is intact without any evidence of gross lesions. Cardiopulmonary assessment is negative for acute distress and she exhibits normal effort. The abdomen is soft, non tender, non distended. Lower extremities are negative for pretibial pitting edema, deep calf tenderness, cyanosis or clubbing.  KPS = 100  100 - Normal; no complaints; no evidence of disease. 90   - Able to carry on normal activity; minor signs or symptoms of disease. 80   - Normal activity with effort; some signs or symptoms of disease. 47   - Cares for self; unable to carry on normal activity or to do active work. 60   - Requires occasional assistance, but is able to care for most of his personal needs. 50   - Requires considerable  assistance and frequent medical care. 46   - Disabled; requires special care and assistance. 17   - Severely disabled; hospital admission is indicated although death not imminent. 53   - Very sick; hospital admission necessary; active supportive treatment necessary. 10   - Moribund; fatal processes progressing rapidly. 0     - Dead  Karnofsky DA, Abelmann [place], Craver LS and Burchenal JH [phone]) The use of the nitrogen mustards in the palliative treatment of carcinoma: with particular reference to bronchogenic carcinoma Cancer 1 [id number]  LABORATORY DATA:  Lab Results  Component Value Date   WBC 6.9 10/02/2020   HGB 13.1 10/02/2020   HCT 39.5 10/02/2020   MCV 90.2 10/02/2020   PLT 288 10/02/2020   Lab Results  Component Value Date   NA 141 10/02/2020   K 4.0 10/02/2020   CL 107 10/02/2020   CO2 25 10/02/2020   Lab Results  Component Value Date   ALT 17 10/02/2020   AST 14 (L) 10/02/2020   ALKPHOS 75 10/02/2020   BILITOT 0.2 (L) 10/02/2020     RADIOGRAPHY: DG Chest Port 1 View  Result Date: 09/15/2020 CLINICAL DATA:  Post bronchoscopy. EXAM: PORTABLE CHEST 1 VIEW COMPARISON:  CT chest dated Aug 15, 2020. FINDINGS: Normal heart size. Unchanged fullness of the left hilum related to underlying lymphadenopathy. Left upper lobe lung nodules better evaluated on CT. No focal consolidation, pleural effusion, or pneumothorax. No acute osseous abnormality. IMPRESSION: 1. No pneumothorax post bronchoscopy. Electronically Signed   By: Titus Dubin M.D.   On: 09/15/2020 09:57   DG C-ARM BRONCHOSCOPY  Result Date: 09/15/2020 C-ARM BRONCHOSCOPY: Fluoroscopy was utilized by the requesting physician.  No radiographic interpretation.      IMPRESSION/PLAN: 1. 40 y.o. 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  Today, we talked to the patient and family about the findings and workup thus far as well as the recommendation to obtain a Dotatate scan  for further evaluation. We discussed the natural history of well differentiated neuroendocrine carcinoma/atypical carcinoid and general treatment, with the gold standard of treatment being surgical resection if feasible.  In cases where surgery is not possible or is not recommended, we discussed the potential role of radiotherapy in the management. We also discussed the possible role for radiotherapy postoperatively in the setting of positive margins or residual disease.  We discussed the available radiation techniques, and focused on the details of logistics and delivery. We reviewed  the anticipated acute and late sequelae associated with radiation in this setting. The patient and her family were encouraged to ask questions that were answered to their stated satisfaction.  At the conclusion of our conversation, the patient is interested in proceeding with the recommended Dotatate scan as well as seeking a second opinion and exploring surgical options prior to making her final decision regarding treatment preference. She knows that she is welcome to call at any time with any further questions or concerns related to radiation. We enjoyed meeting her today. We will share our discussion with Dr. Lamonte Sakai and look forward to continuing to follow her progress and further participate in her care if clinically indicated.  We personally spent 80 minutes in this encounter including chart review, reviewing radiological studies, meeting face-to-face with the patient, entering orders and completing documentation.    Nicholos Johns, PA-C    Tyler Pita, MD  [facility] Direct Dial: [phone]  Fax: [phone] [email or web address]  Skype  LinkedIn

## 2020-10-03 ENCOUNTER — Telehealth: Payer: Self-pay | Admitting: *Deleted

## 2020-10-03 ENCOUNTER — Encounter: Payer: Self-pay | Admitting: *Deleted

## 2020-10-03 NOTE — Telephone Encounter (Signed)
I received a message that scans are authorized. I called radiology to schedule.  I was given dates and times for scans.  I called Ms.  Barnhart to update on appts but was unable to reach. I did leave vm message with my name and phone number to call.

## 2020-10-03 NOTE — Progress Notes (Signed)
I followed up on Ms. Purdum's authorization for her scans.  They are not authorized yet. I reached out to [place] team for an update.

## 2020-10-03 NOTE — Progress Notes (Signed)
I had faxed Dr. Abran [name] charge sheet to [facility] yesterday.  I contacted their office and Rayn updated me that they received it.

## 2020-10-03 NOTE — Telephone Encounter (Signed)
Dr. Lamonte Sakai, I just wanted to make you aware that the patient requested to cancel her appointment with you on 6/29.

## 2020-10-06 ENCOUNTER — Ambulatory Visit (HOSPITAL_COMMUNITY)
Admission: RE | Admit: 2020-10-06 | Discharge: 2020-10-06 | Disposition: A | Discharge status: Home / Self Care | Payer: BC Managed Care – PPO | Source: Ambulatory Visit | Attending: Internal Medicine | Admitting: Internal Medicine

## 2020-10-06 ENCOUNTER — Other Ambulatory Visit: Payer: Self-pay

## 2020-10-06 DIAGNOSIS — C3490 Malignant neoplasm of unspecified part of unspecified bronchus or lung: Secondary | ICD-10-CM | POA: Diagnosis present

## 2020-10-06 IMAGING — MR MR HEAD WO/W CM
15 series · 48 of 48 positions shown · IV contrast (gadavist)
Comparison: [DATE]

CLINICAL DATA: Lung cancer, staging; history of multiple sclerosis

EXAM:
MRI HEAD WITHOUT AND WITH CONTRAST
TECHNIQUE: Multiplanar, multiecho pulse sequences of the brain and surrounding
structures were obtained without and with intravenous contrast.
CONTRAST:  7mL GADAVIST GADOBUTROL 1 MMOL/ML IV SOLN

[Series 5: DWI · axial · 3.0mm · 1.36mm/px · z∈[-42,+98]mm · 5 of 96 slices shown (1 of 2)]
[im 1/96]
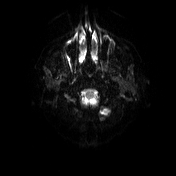
[im 24/96]
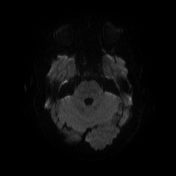
[im 48/96]
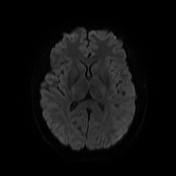
[im 72/96]
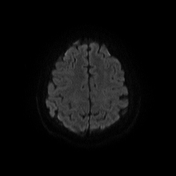
[im 96/96]
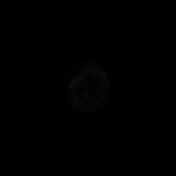

[Series 6: DWI · axial · 3.0mm · 1.36mm/px · z∈[-42,+98]mm · 3 of 48 slices shown (2 of 2)]
[im 1/48]
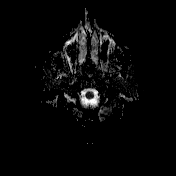
[im 24/48]
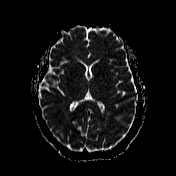
[im 48/48]
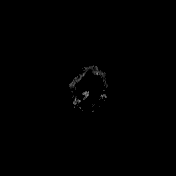

[Series 7: T1 · sagittal · 5.0mm · 0.75mm/px · 1 of 24 slices shown (1 of 2)]
[im 1/24]
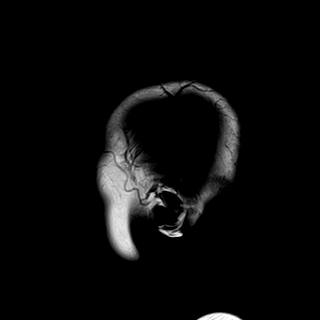

[Series 8: T2 · axial · 5.0mm · 0.62mm/px · 1 of 24 slices shown]
[im 1/24]
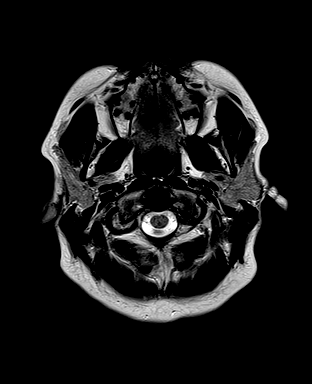

[Series 9: swi_images · axial · 3.0mm · 0.75mm/px · z∈[-55,+107]mm · 3 of 56 slices shown]
[im 1/56]
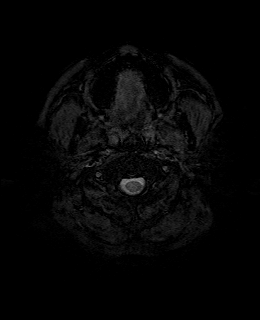
[im 28/56]
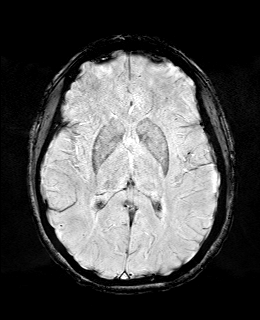
[im 56/56]
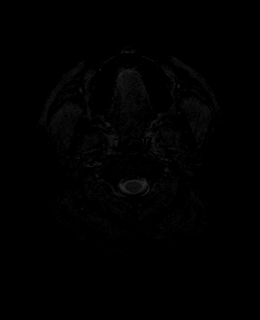

[Series 11: FLAIR · axial · 3.0mm · 0.75mm/px · z∈[-49,+102]mm · 2 of 52 slices shown (1 of 2)]
[im 1/52]
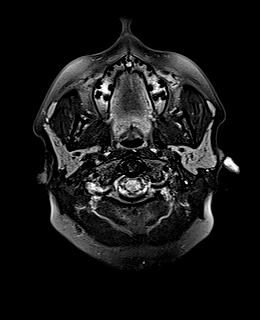
[im 52/52]
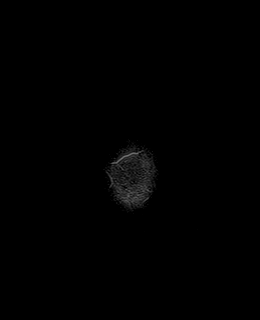

[Series 12: T1 · axial · 1.0mm · 0.94mm/px · z∈[-42,+99]mm · 7 of 144 slices shown (2 of 2)]
[im 1/144]
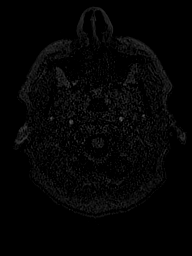
[im 24/144]
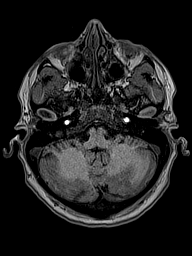
[im 48/144]
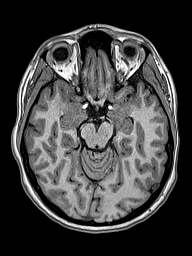
[im 72/144]
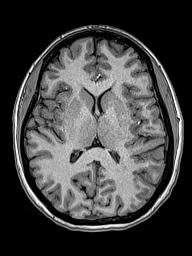
[im 96/144]
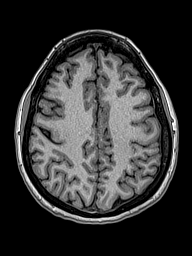
[im 120/144]
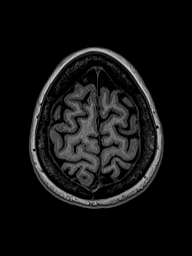
[im 144/144]
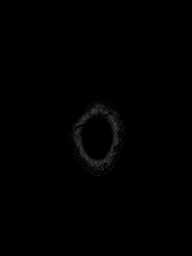

[Series 13: FLAIR · sagittal · 1.1mm · 0.50mm/px · 6 of 128 slices shown (2 of 2)]
[im 1/128]
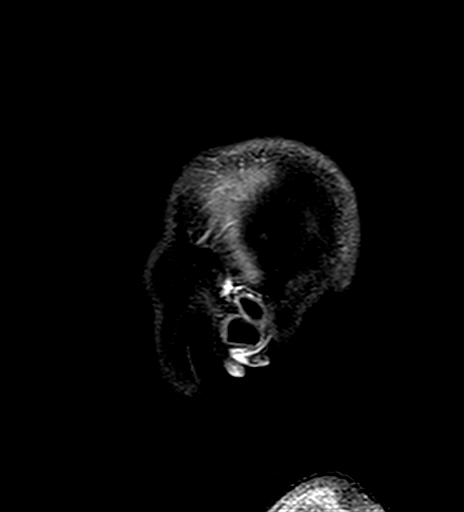
[im 26/128]
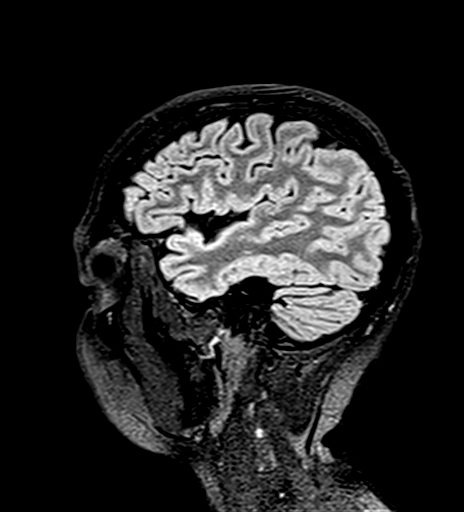
[im 51/128]
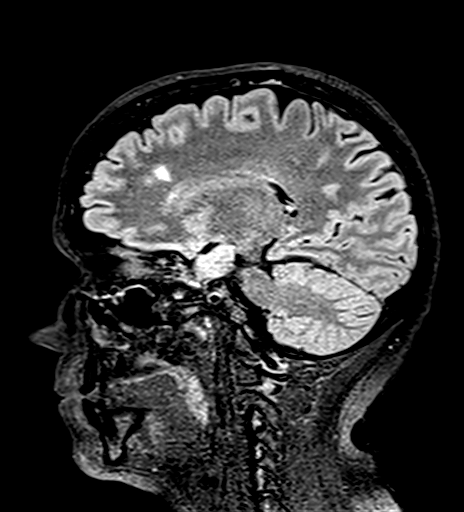
[im 77/128]
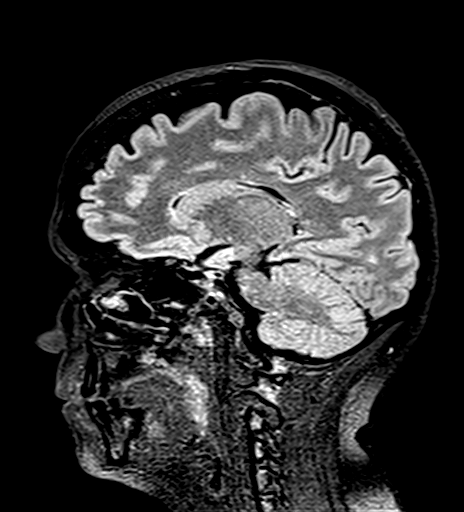
[im 102/128]
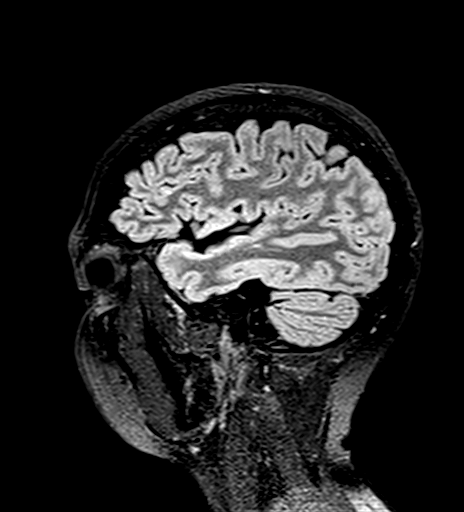
[im 128/128]
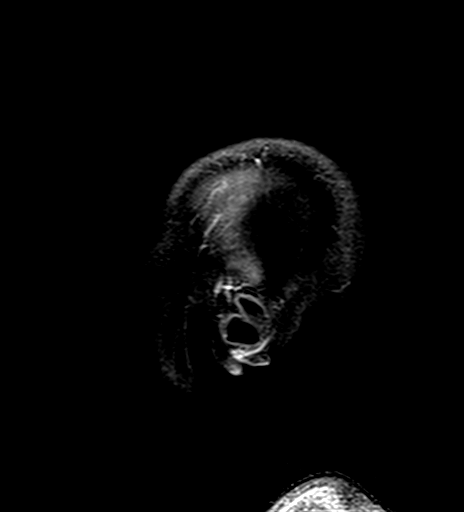

[Series 14: cor dwi_tracew · coronal · 5.0mm · 1.53mm/px · 2 of 52 slices shown]
[im 1/52]
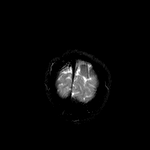
[im 52/52]
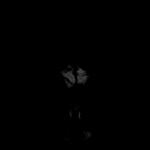

[Series 15: cor dwi_adc · coronal · 5.0mm · 1.53mm/px · 1 of 26 slices shown]
[im 1/26]
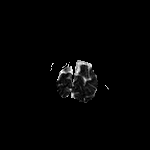

[Series 16: T2 post-contrast · coronal · 5.0mm · 0.57mm/px · 1 of 26 slices shown]
[im 1/26]
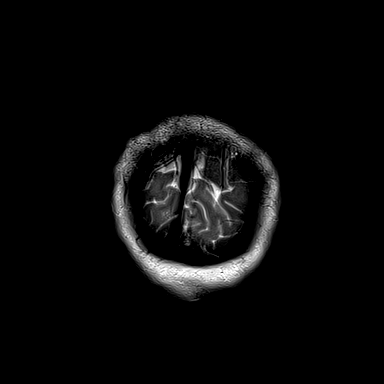

[Series 17: T1 post-contrast · axial · 1.0mm · 0.94mm/px · z∈[-42,+99]mm · 7 of 144 slices shown (1 of 4)]
[im 1/144]
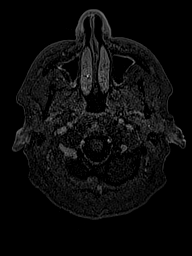
[im 24/144]
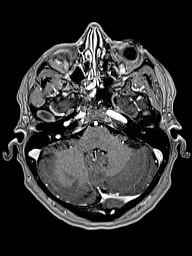
[im 48/144]
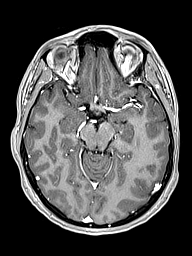
[im 72/144]
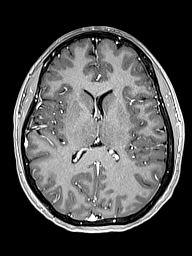
[im 96/144]
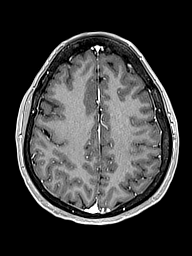
[im 120/144]
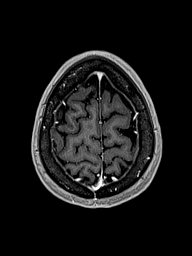
[im 144/144]
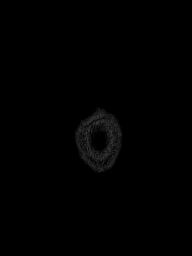

[Series 18: T1 post-contrast · coronal · 5.0mm · 0.43mm/px · 1 of 26 slices shown (2 of 4)]
[im 1/26]
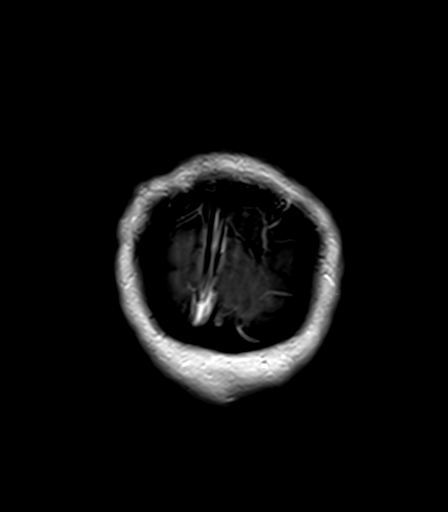

[Series 19: T1 post-contrast · sagittal · 5.0mm · 0.75mm/px · 1 of 24 slices shown (3 of 4)]
[im 1/24]
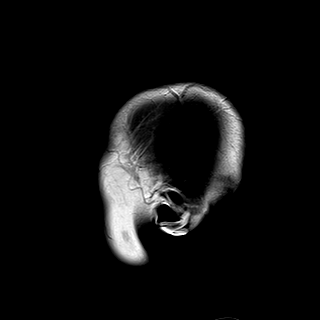

[Series 20: T1 post-contrast · axial · 1.0mm · 0.94mm/px · z∈[-57,+100]mm · 7 of 160 slices shown (4 of 4)]
[im 1/160]
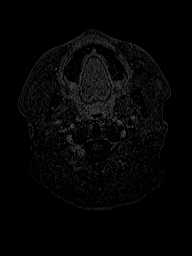
[im 27/160]
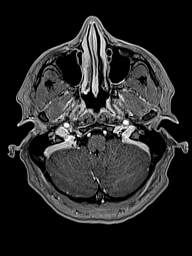
[im 54/160]
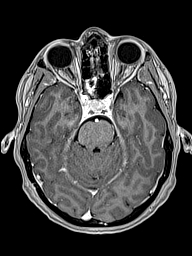
[im 80/160]
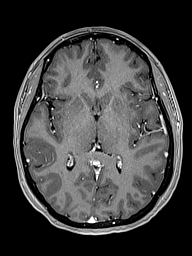
[im 107/160]
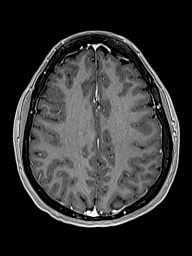
[im 133/160]
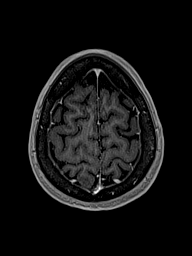
[im 160/160]
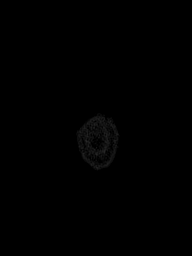

[48 of 48 positions shown; findings below may reference images not displayed]

FINDINGS: Brain: There is no acute infarction or intracranial hemorrhage.
There is no intracranial mass, mass effect, or edema. There is no
hydrocephalus or extra-axial fluid collection. Scattered foci of T2
hyperintensity in the supratentorial white likely reflect chronic
demyelination history. Ventricles and sulci are normal in size and
configuration. No abnormal enhancement.

Vascular: Major vessel flow voids at the skull base are preserved.

Skull and upper cervical spine: Normal marrow signal is preserved.

Sinuses/Orbits: Persistent right sphenoid sinus inflammatory
changes. Orbits are unremarkable.

Other: Sella is unremarkable.  Mastoid air cells are clear.
IMPRESSION: No evidence of intracranial metastatic disease.

Mild burden of chronic demyelinating lesions.

## 2020-10-06 MED ORDER — GADOBUTROL 1 MMOL/ML IV SOLN
7.0000 mL | Freq: Once | INTRAVENOUS | Status: AC | PRN
  Administered 2020-10-06: 7 mL via INTRAVENOUS

## 2020-10-06 NOTE — Telephone Encounter (Signed)
Yes - thank you

## 2020-10-06 NOTE — Telephone Encounter (Signed)
Hey. I can access the reports from the MRIs she had done at [facility], but I have no images attached to them. How do I get access to them? Is that even possible? Wouldn't she have to request them or can we? I'm sorry. I just don't know how to get this information for her.

## 2020-10-07 ENCOUNTER — Encounter: Payer: Self-pay | Admitting: *Deleted

## 2020-10-07 ENCOUNTER — Other Ambulatory Visit: Payer: Self-pay | Admitting: Nurse Practitioner

## 2020-10-07 ENCOUNTER — Telehealth: Payer: Self-pay | Admitting: *Deleted

## 2020-10-07 ENCOUNTER — Ambulatory Visit (HOSPITAL_COMMUNITY): Payer: BC Managed Care – PPO

## 2020-10-07 ENCOUNTER — Encounter: Payer: Self-pay | Admitting: Medical Oncology

## 2020-10-07 DIAGNOSIS — F411 Generalized anxiety disorder: Secondary | ICD-10-CM

## 2020-10-07 DIAGNOSIS — C7A8 Other malignant neuroendocrine tumors: Secondary | ICD-10-CM

## 2020-10-07 MED ORDER — HYDROXYZINE HCL 10 MG PO TABS
10.0000 mg | ORAL_TABLET | Freq: Two times a day (BID) | ORAL | 1 refills | Status: DC | PRN
Start: 2020-10-07 — End: 2021-08-13

## 2020-10-07 MED ORDER — ESCITALOPRAM OXALATE 5 MG PO TABS: 5.0000 mg | ORAL_TABLET | Freq: Every day | ORAL | 1 refills | Status: DC

## 2020-10-07 NOTE — Telephone Encounter (Signed)
I contacted PA Ashlyn  regarding Ms. Donna Higgins's MRI.  She updated me that I can call [name] with results. I called but was unable to reach. I did leave vm message with my name and phone number to call.

## 2020-10-07 NOTE — Progress Notes (Signed)
I spoke to Donna Higgins and she would like to be seen with thoracic surgery with experience in her disease.  I updated Dr. Julien Nordmann. He requested referral to Dr. Lerry Paterson.  I called his office and obtained fax number to completed referral. I will get needed documents and fax to his office.

## 2020-10-07 NOTE — Progress Notes (Signed)
Patient with great deal of situational stress. Will start lexapro 5mg  daily. Also add hydroxyzine 10mg  up to twice daily as needed fr acute anxiety. Both prescriptions sent to the pharmacy.

## 2020-10-07 NOTE — Progress Notes (Signed)
I faxed referral request to Dr. Elenor Quinones office at [facility].

## 2020-10-08 ENCOUNTER — Ambulatory Visit: Payer: BC Managed Care – PPO | Admitting: Emergency Medicine

## 2020-10-08 ENCOUNTER — Encounter: Payer: Self-pay | Admitting: Medical Oncology

## 2020-10-09 ENCOUNTER — Encounter: Payer: Self-pay | Admitting: *Deleted

## 2020-10-09 NOTE — Progress Notes (Signed)
Re-faxed referral with confirmation fax back.

## 2020-10-09 NOTE — Progress Notes (Signed)
I called [name] to see if they received Donna Higgins's records I faxed on 6/28 with confirmation fax obtained.  I was updated by his office that the fax did not come through. I will re-fax records.

## 2020-10-10 ENCOUNTER — Emergency Department (HOSPITAL_COMMUNITY)
Admission: EM | Admit: 2020-10-10 | Discharge: 2020-10-10 | Disposition: A | Discharge status: Home / Self Care | Payer: BC Managed Care – PPO | Attending: Emergency Medicine | Admitting: Emergency Medicine

## 2020-10-10 ENCOUNTER — Telehealth: Payer: Self-pay

## 2020-10-10 ENCOUNTER — Emergency Department (HOSPITAL_COMMUNITY): Payer: BC Managed Care – PPO

## 2020-10-10 ENCOUNTER — Other Ambulatory Visit: Payer: Self-pay

## 2020-10-10 DIAGNOSIS — N9489 Other specified conditions associated with female genital organs and menstrual cycle: Secondary | ICD-10-CM | POA: Insufficient documentation

## 2020-10-10 DIAGNOSIS — J181 Lobar pneumonia, unspecified organism: Secondary | ICD-10-CM | POA: Diagnosis not present

## 2020-10-10 DIAGNOSIS — J189 Pneumonia, unspecified organism: Secondary | ICD-10-CM

## 2020-10-10 DIAGNOSIS — R000 Tachycardia, unspecified: Secondary | ICD-10-CM | POA: Diagnosis not present

## 2020-10-10 DIAGNOSIS — R0602 Shortness of breath: Secondary | ICD-10-CM | POA: Diagnosis present

## 2020-10-10 DIAGNOSIS — F17200 Nicotine dependence, unspecified, uncomplicated: Secondary | ICD-10-CM | POA: Diagnosis not present

## 2020-10-10 LAB — PROTIME-INR
INR: 0.9 (ref 0.8–1.2)
Prothrombin Time: 12.5 seconds (ref 11.4–15.2)

## 2020-10-10 LAB — TROPONIN I (HIGH SENSITIVITY)
Troponin I (High Sensitivity): <2 ng/L (ref <18)
Troponin I (High Sensitivity): <2 ng/L (ref <18)

## 2020-10-10 LAB — CBC
HCT: 42.1 % (ref 36.0–46.0)
Hemoglobin: 13.8 g/dL (ref 12.0–15.0)
MCH: 29.9 pg (ref 26.0–34.0)
MCHC: 32.8 g/dL (ref 30.0–36.0)
MCV: 91.3 fL (ref 80.0–100.0)
Platelets: 382 10*3/uL (ref 150–400)
RBC: 4.61 MIL/uL (ref 3.87–5.11)
RDW: 12.3 % (ref 11.5–15.5)
WBC: 8.3 10*3/uL (ref 4.0–10.5)
nRBC: 0 % (ref 0.0–0.2)

## 2020-10-10 LAB — BASIC METABOLIC PANEL
Anion gap: 6 (ref 5–15)
BUN: 13 mg/dL (ref 6–20)
CO2: 26 mmol/L (ref 22–32)
Calcium: 9.4 mg/dL (ref 8.9–10.3)
Chloride: 106 mmol/L (ref 98–111)
Creatinine, Ser: 0.72 mg/dL (ref 0.44–1.00)
GFR, Estimated: >60 mL/min (ref >60)
Glucose, Bld: 100 mg/dL — ABNORMAL HIGH (ref 70–99)
Potassium: 4 mmol/L (ref 3.5–5.1)
Sodium: 138 mmol/L (ref 135–145)

## 2020-10-10 LAB — I-STAT BETA HCG BLOOD, ED (MC, WL, AP ONLY): I-stat hCG, quantitative: <5 m[IU]/mL (ref <5)

## 2020-10-10 IMAGING — CT CT ANGIO CHEST
2 of 6 series · 18 of 36 positions shown · IV contrast (omnipaque)
Comparison: [DATE]

CLINICAL DATA: Chest pain shortness of breath recent diagnosis of
lung cancer [DATE]

EXAM:
CT ANGIOGRAPHY CHEST WITH CONTRAST
TECHNIQUE: Multidetector CT imaging of the chest was performed using the
standard protocol during bolus administration of intravenous
contrast. Multiplanar CT image reconstructions and MIPs were
obtained to evaluate the vascular anatomy.
CONTRAST:  75mL OMNIPAQUE IOHEXOL 350 MG/ML SOLN

[Series 8: thins · axial · 0.76mm/px · z∈[+1284,+1494]mm · 17 of 237 slices shown]
[im 14/237  lung]
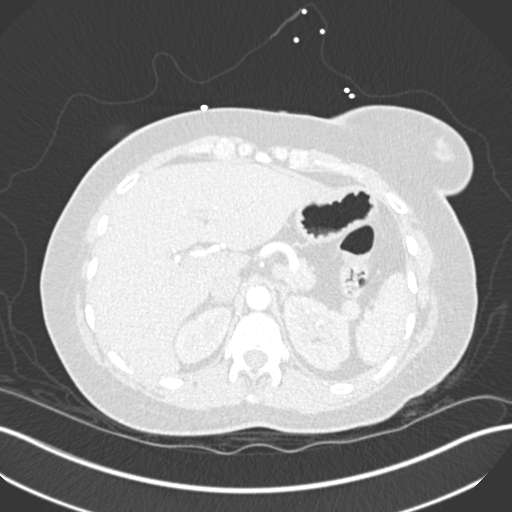
[im 27/237  mediastinal]
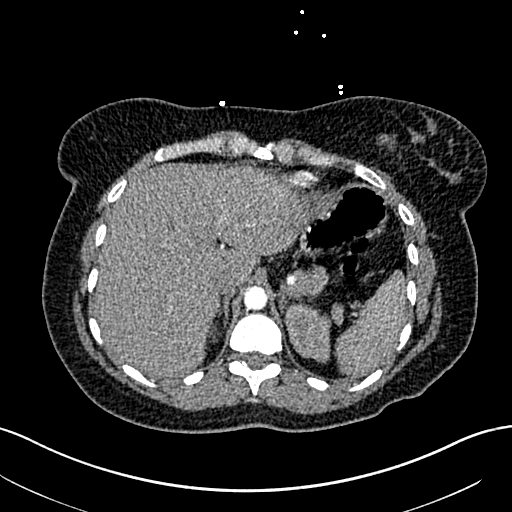
[im 40/237  lung]
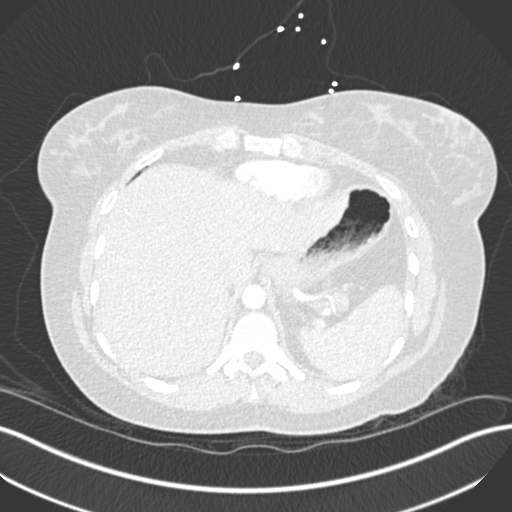
[im 53/237  mediastinal]
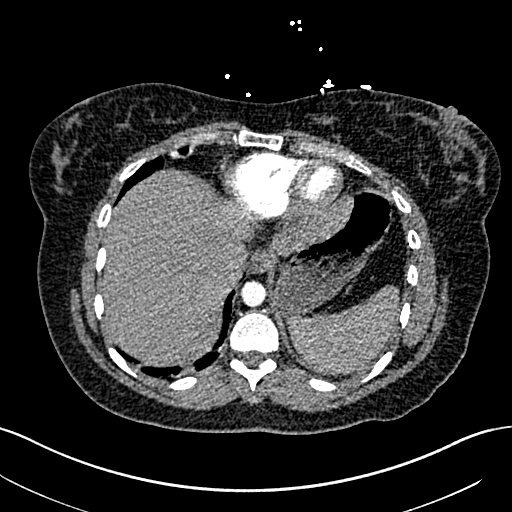
[im 66/237  lung]
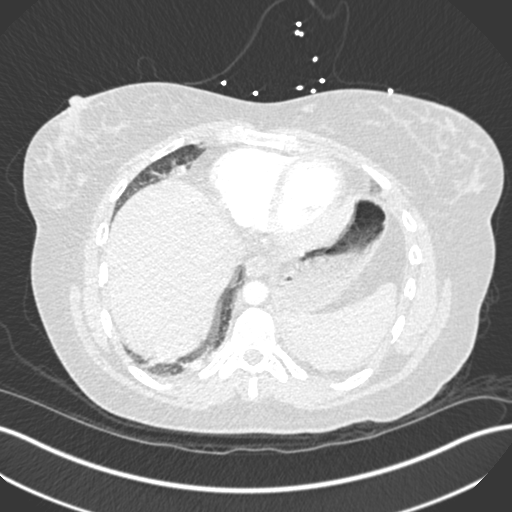
[im 79/237  mediastinal]
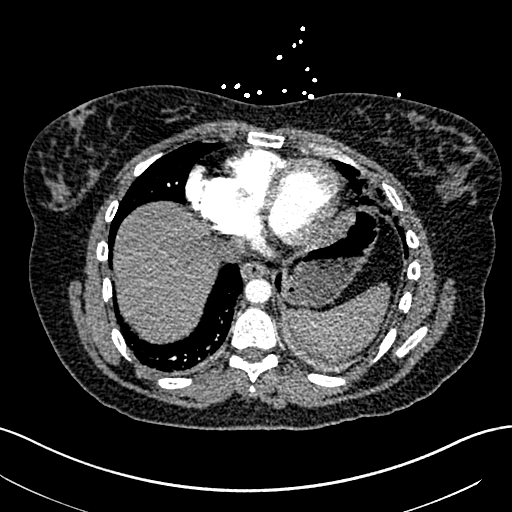
[im 92/237  lung]
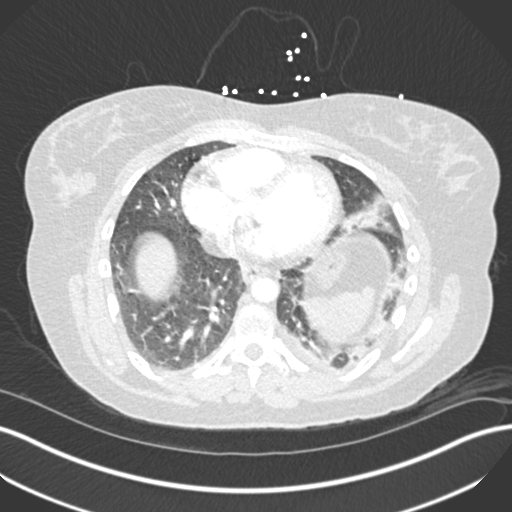
[im 105/237  mediastinal]
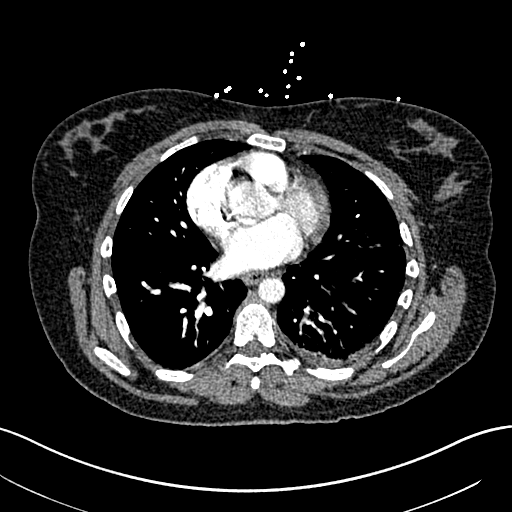
[im 119/237  lung]
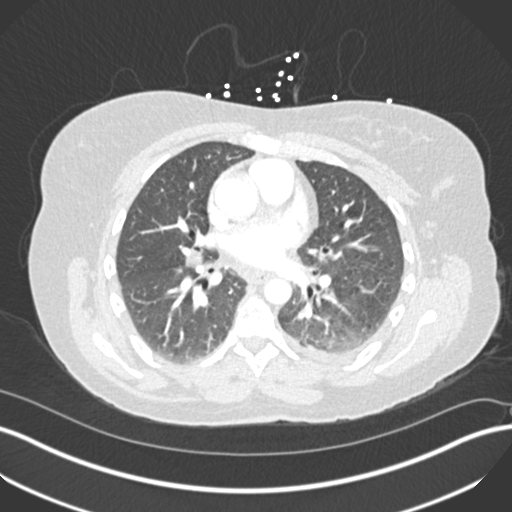
[im 132/237  mediastinal]
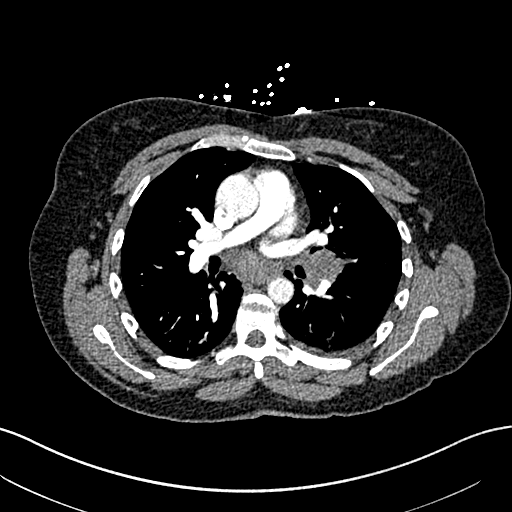
[im 145/237  lung]
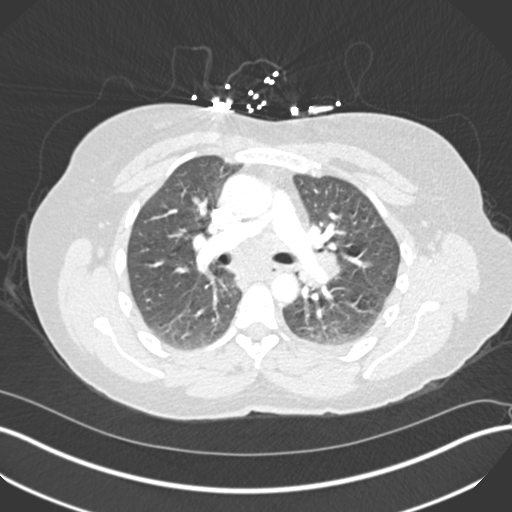
[im 158/237  mediastinal]
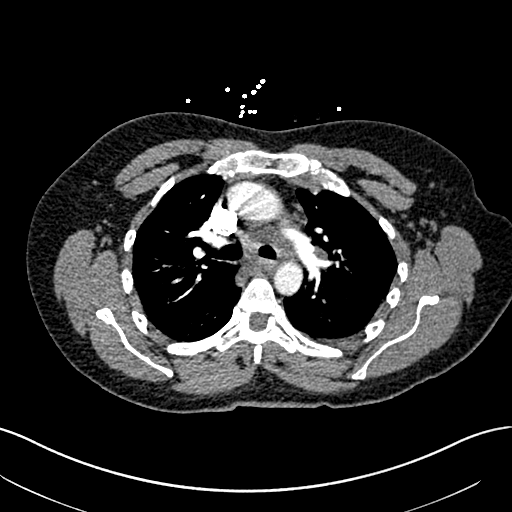
[im 171/237  lung]
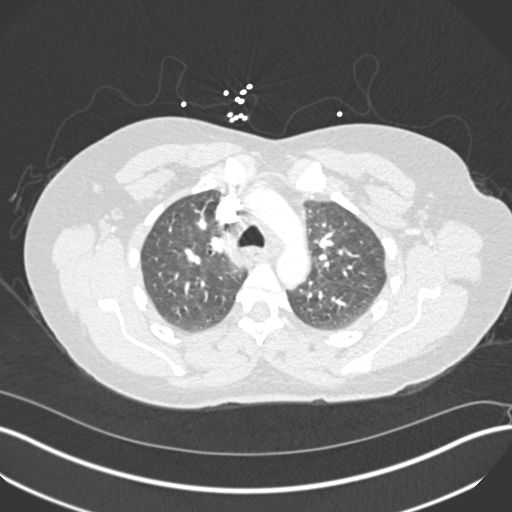
[im 184/237  mediastinal]
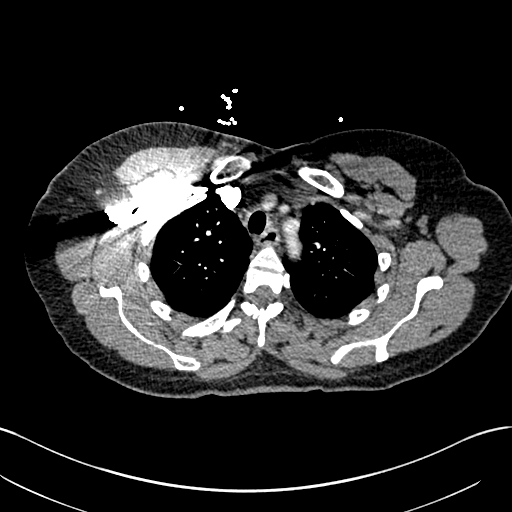
[im 197/237  lung]
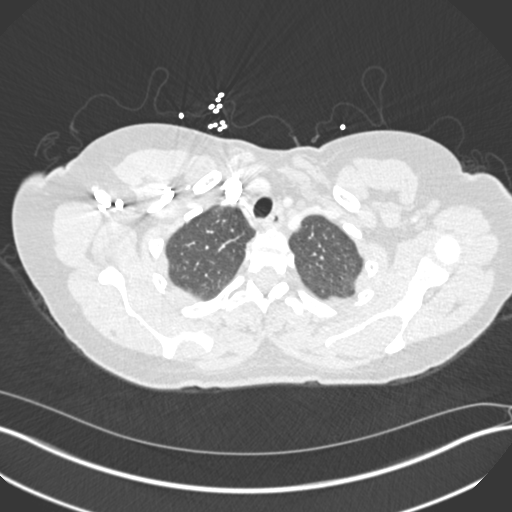
[im 210/237  mediastinal]
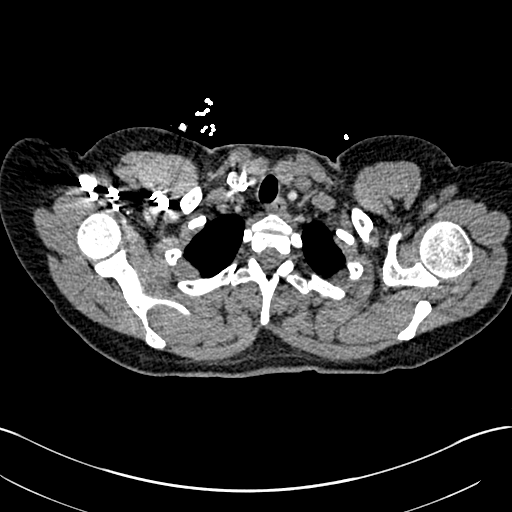
[im 223/237  lung]
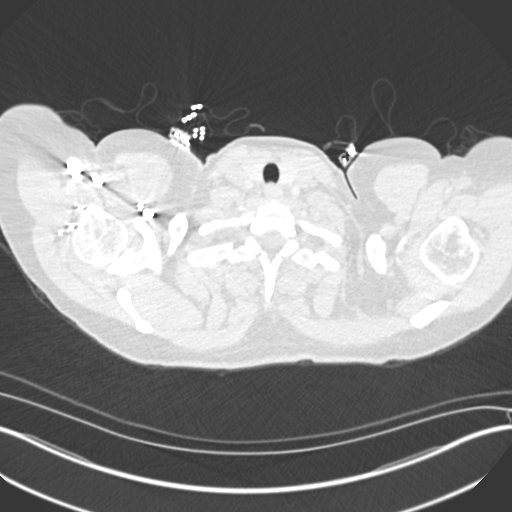

[Series 10: coronal mpr · coronal · 0.50mm/px · 1 of 151 slices shown]
[im 76/151  mediastinal]
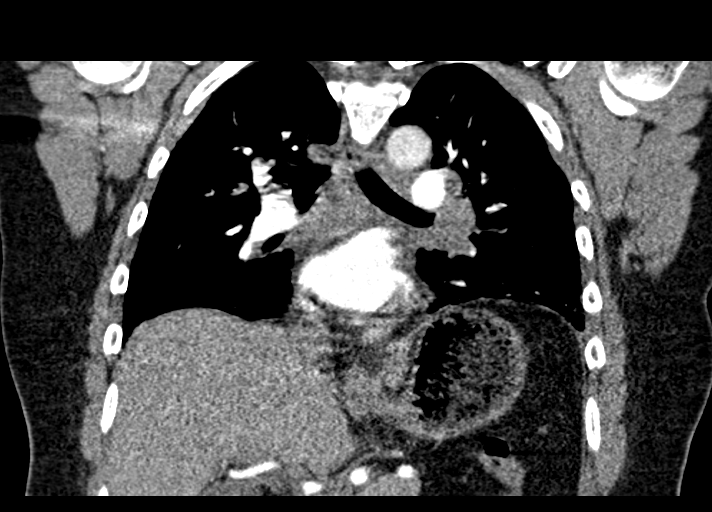

[18 of 36 positions shown; findings below may reference images not displayed]

FINDINGS: Cardiovascular: Intact thoracic aorta. Negative for aneurysm or
dissection. No mediastinal hemorrhage or hematoma.

Pulmonary arteries appear patent and normal in caliber. Negative for
significant acute filling defect or pulmonary embolus by CTA.

Normal heart size.  No pericardial effusion.

Central venous structures are patent.  No MAJORO process.

Mediastinum/Nodes: Thyroid unremarkable. Trachea and central airways
are patent. Esophagus nondilated. No hiatal hernia.

No supraclavicular or axillary adenopathy.

Redemonstration of bulky subcarinal and left hilar adenopathy.
Subcarinal nodal mass has enlarged measuring 3.2 cm, previously
cm. Left hilar nodal mass also enlarged measuring 3.3 cm, previously
3 cm. Slight enlargement of contralateral right hilar lymph nodes
now measuring up to 12 mm. Findings compatible with progression of
nodal metastatic disease.

Lungs/Pleura: Stable left upper lobe subpleural fissural mass
measuring 17 x 10 mm. Stable adjacent left upper lobe nodule
medially measuring 10 mm.

Increased inferior lingula and left lower lobe patchy basilar
airspace process. Difficult to exclude pneumonia. Trace right base
atelectasis. Small left effusion noted.

Upper Abdomen: No acute upper abdominal finding.

Musculoskeletal: No acute osseous finding.

Review of the MIP images confirms the above findings.
IMPRESSION: Negative for significant acute pulmonary embolus by CTA.

Intact aorta without aneurysm or dissection.

No acute intrathoracic vascular finding.

Slight interval progression and enlargement of the bulky subcarinal
and left hilar adenopathy.

2 stable adjacent left upper lobe pulmonary nodules.

New inferior lingula and left lower lobe patchy airspace process and
trace left effusion concerning for left basilar pneumonia.

Trace right base atelectasis.

## 2020-10-10 IMAGING — CR DG CHEST 2V
2 series · 2 of 2 positions shown · non-contrast
Comparison: [DATE], [DATE]

CLINICAL DATA: Lung cancer, chest pain, shortness of breath

EXAM:
CHEST - 2 VIEW

[w chest lat]
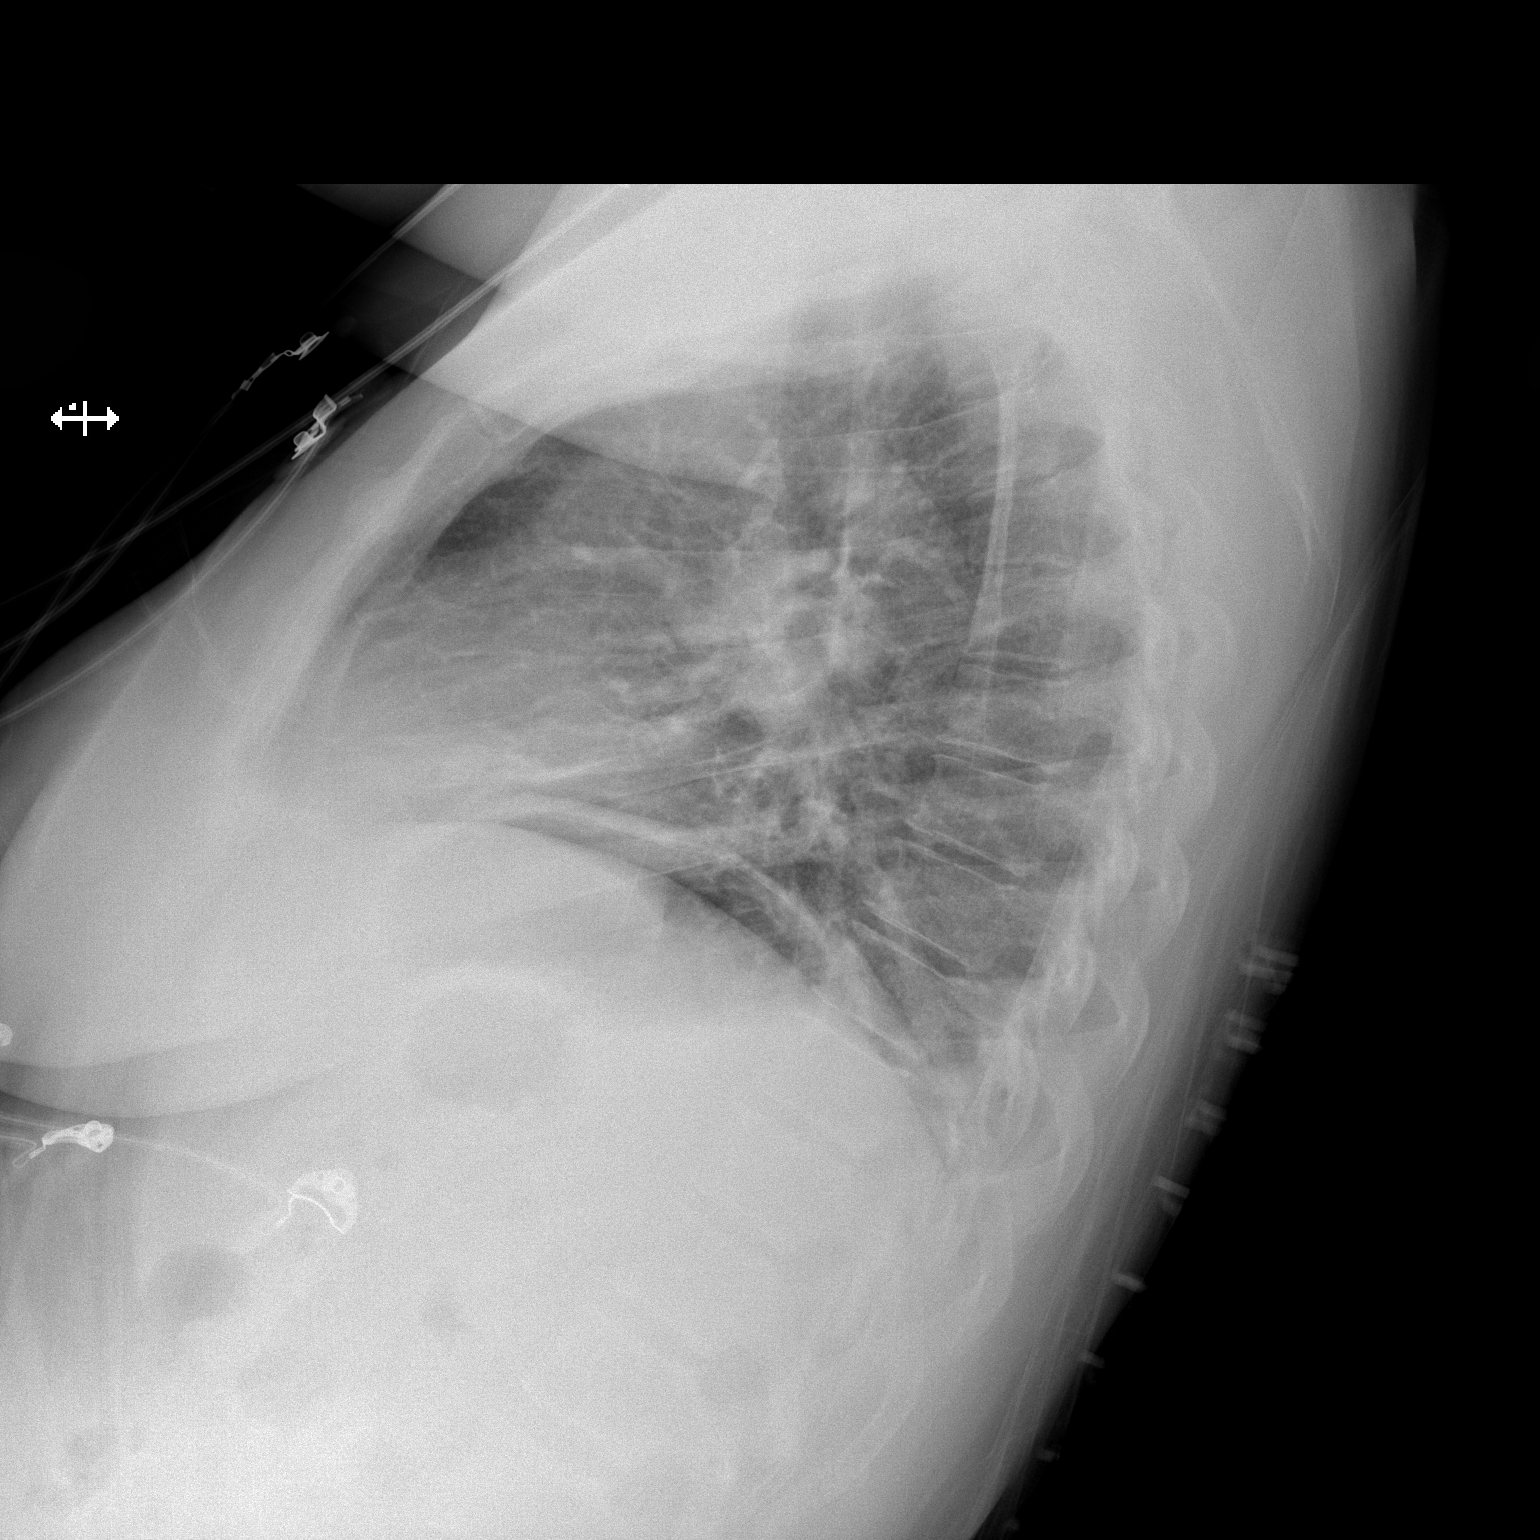

[x chest ap]
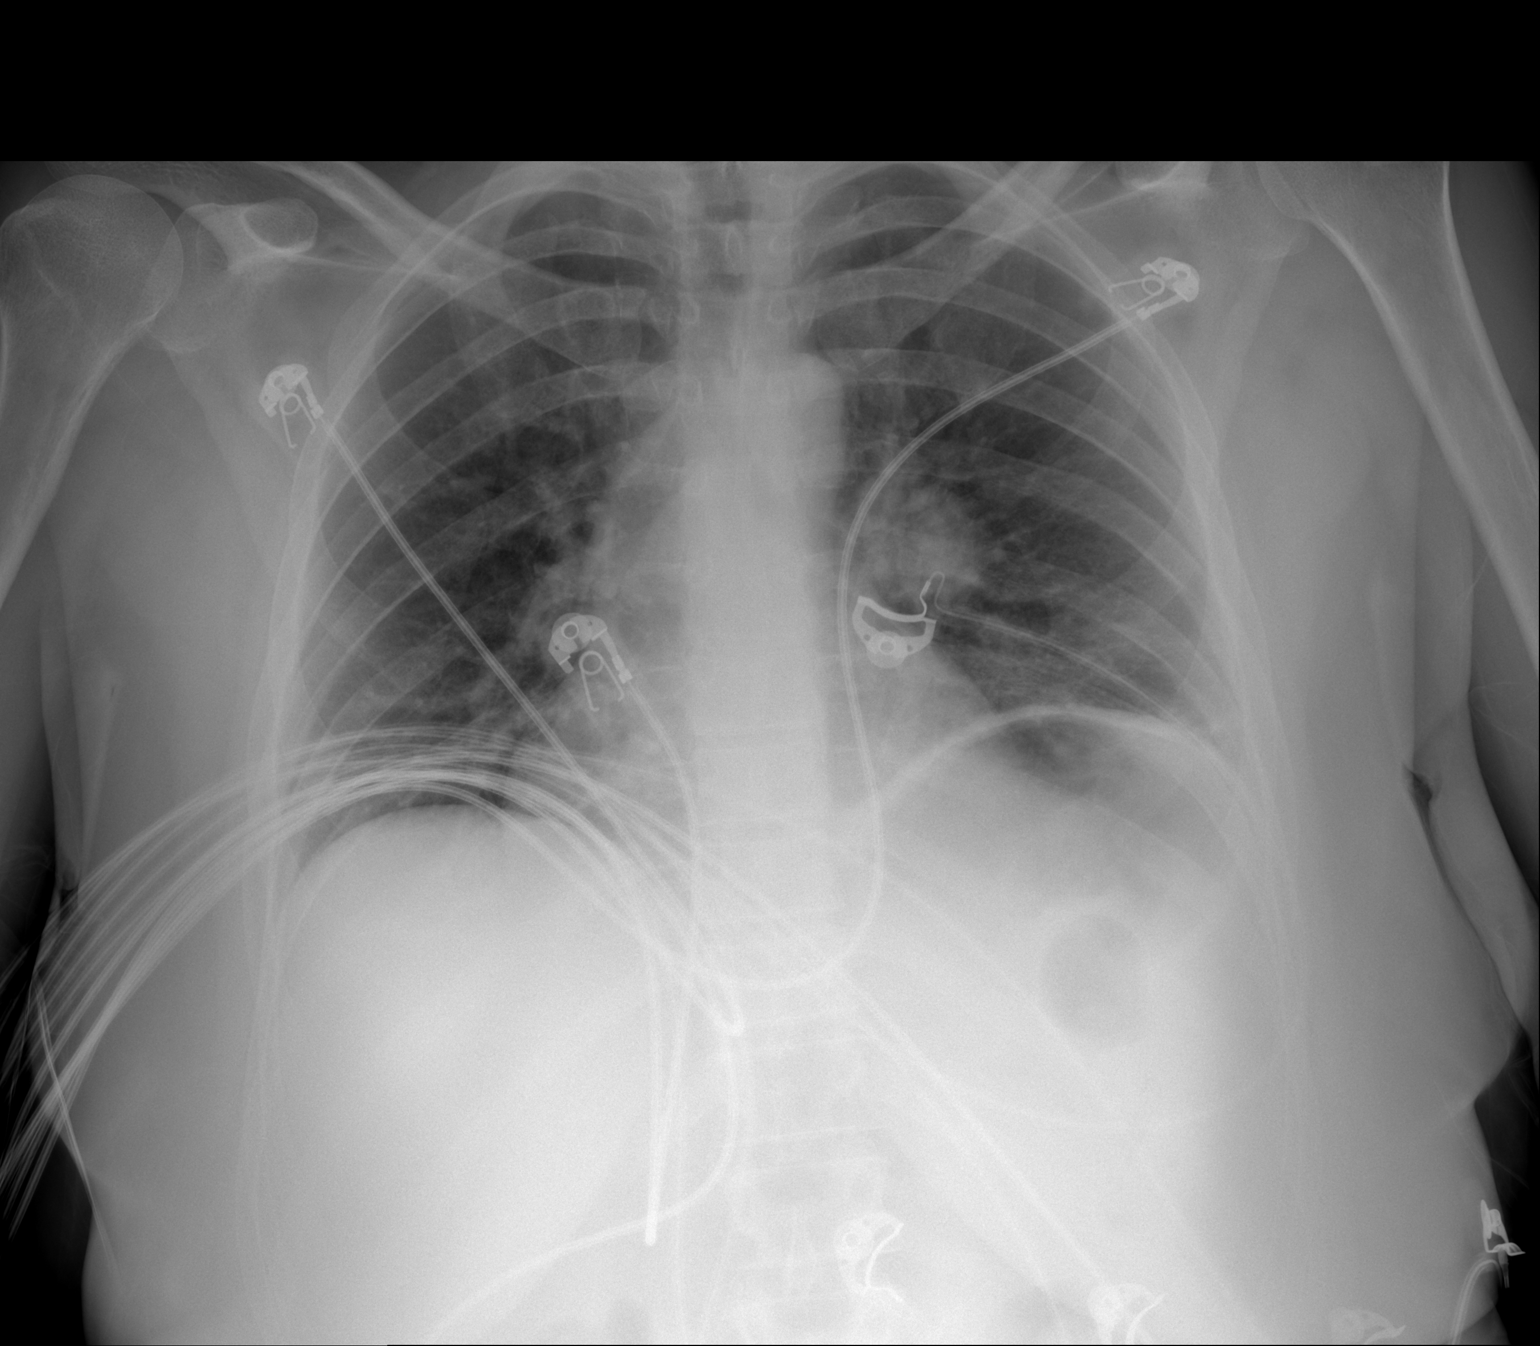

[2 of 2 positions shown; findings below may reference images not displayed]

FINDINGS: Lower lung volumes with minor basilar atelectasis. Rounded left
hilar mass, overall stable in size and better demonstrated by chest
CT comparison. No superimposed acute pneumonia, significant collapse
or consolidation. No effusion or pneumothorax. Trachea midline.
Monitor leads overlie the chest.
IMPRESSION: Stable left hilar mass.

Low lung volumes and basilar atelectasis.

No significant interval change or acute process by plain radiography

## 2020-10-10 MED ORDER — SODIUM CHLORIDE (PF) 0.9 % IJ SOLN
INTRAMUSCULAR | Status: AC
  Filled 2020-10-10: qty 50

## 2020-10-10 MED ORDER — DOXYCYCLINE HYCLATE 100 MG PO CAPS: 100.0000 mg | ORAL_CAPSULE | Freq: Two times a day (BID) | ORAL | 0 refills | Status: DC

## 2020-10-10 MED ORDER — SODIUM CHLORIDE 0.9% FLUSH
3.0000 mL | Freq: Once | INTRAVENOUS | Status: AC
  Administered 2020-10-10: 3 mL via INTRAVENOUS

## 2020-10-10 MED ORDER — IOHEXOL 350 MG/ML SOLN
80.0000 mL | Freq: Once | INTRAVENOUS | Status: AC | PRN
  Administered 2020-10-10: 75 mL via INTRAVENOUS

## 2020-10-10 MED ORDER — OXYCODONE-ACETAMINOPHEN 5-325 MG PO TABS: 1.0000 | ORAL_TABLET | Freq: Four times a day (QID) | ORAL | 0 refills | Status: DC | PRN

## 2020-10-10 MED ORDER — AMOXICILLIN-POT CLAVULANATE 875-125 MG PO TABS
1.0000 | ORAL_TABLET | Freq: Once | ORAL | Status: AC
  Administered 2020-10-10: 1 via ORAL
  Filled 2020-10-10: qty 1

## 2020-10-10 MED ORDER — NAPROXEN 375 MG PO TABS: 375.0000 mg | ORAL_TABLET | Freq: Two times a day (BID) | ORAL | 0 refills | Status: DC

## 2020-10-10 MED ORDER — FENTANYL CITRATE (PF) 100 MCG/2ML IJ SOLN
100.0000 ug | Freq: Once | INTRAMUSCULAR | Status: AC
  Administered 2020-10-10: 100 ug via INTRAVENOUS
  Filled 2020-10-10: qty 2

## 2020-10-10 MED ORDER — SODIUM CHLORIDE 0.9 % IV BOLUS
1000.0000 mL | Freq: Once | INTRAVENOUS | Status: AC
  Administered 2020-10-10: 1000 mL via INTRAVENOUS

## 2020-10-10 MED ORDER — DOXYCYCLINE HYCLATE 100 MG PO TABS
100.0000 mg | ORAL_TABLET | Freq: Once | ORAL | Status: AC
  Administered 2020-10-10: 100 mg via ORAL
  Filled 2020-10-10: qty 1

## 2020-10-10 MED ORDER — AMOXICILLIN-POT CLAVULANATE 875-125 MG PO TABS: 1.0000 | ORAL_TABLET | Freq: Two times a day (BID) | ORAL | 0 refills | Status: DC

## 2020-10-10 MED ORDER — KETOROLAC TROMETHAMINE 15 MG/ML IJ SOLN
15.0000 mg | Freq: Once | INTRAMUSCULAR | Status: AC
  Administered 2020-10-10: 15 mg via INTRAVENOUS
  Filled 2020-10-10: qty 1

## 2020-10-10 NOTE — ED Provider Notes (Signed)
[facility] DEPT Provider Note   CSN: [id number] Arrival date & time: 10/10/20  0856     History Chief Complaint  Patient presents with   Chest Pain   Shortness of Breath    Donna Higgins is a 40 y.o. female.  HPI 40 year old female presents with severe left-sided chest pain.  On and off has had some mild discomfort that she thought might be anxiety for the last couple days.  However since around 745 this morning the pain has been way more severe.  It comes and goes in waves.  It is in her left mid axillary chest.  It does worsen with breathing.  No dyspnea.  No meds have been given and the pain is severe.  No leg swelling.  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   Hilar lymphadenopathy 08/05/2020   History of smoking less than 10 pack years 08/05/2020   Encounter to establish care 08/05/2020   Allergic rhinitis 07/17/2015   Routine general medical examination at a health care facility 05/22/2012   Vaginal birth after cesarean (5/11) 08/22/2011    Past Surgical History:  Procedure Laterality Date   BRONCHIAL BIOPSY  09/15/2020   Procedure: BRONCHIAL BIOPSIES;  Surgeon: Collene Gobble, MD;  Location: [facility];  Service: Pulmonary;;   BRONCHIAL BRUSHINGS  09/15/2020   Procedure: BRONCHIAL BRUSHINGS;  Surgeon: Collene Gobble, MD;  Location: [place];  Service: Pulmonary;;   BRONCHIAL NEEDLE ASPIRATION BIOPSY  09/15/2020   Procedure: BRONCHIAL  NEEDLE ASPIRATION BIOPSIES;  Surgeon: Collene Gobble, MD;  Location: [place];  Service: Pulmonary;;   BRONCHIAL WASHINGS  09/15/2020   Procedure: BRONCHIAL WASHINGS;  Surgeon: Collene Gobble, MD;  Location: [facility];  Service: Pulmonary;;   CESAREAN SECTION  2009   VIDEO BRONCHOSCOPY WITH ENDOBRONCHIAL NAVIGATION Left 09/15/2020   Procedure: VIDEO BRONCHOSCOPY WITH ENDOBRONCHIAL NAVIGATION;  Surgeon: Collene Gobble, MD;  Location: [facility] END[facility];  Service: Pulmonary;  Laterality: Left;   VIDEO BRONCHOSCOPY WITH ENDOBRONCHIAL ULTRASOUND Left 09/15/2020   Procedure: VIDEO BRONCHOSCOPY WITH ENDOBRONCHIAL ULTRASOUND;  Surgeon: Collene Gobble, MD;  Location: [facility] ENDOSCOPY;  Service: Pulmonary;  Laterality: Left;   WISDOM TOOTH EXTRACTION       OB History     Gravida  2   Para  2   Term  2   Preterm      AB      Living  2      SAB      IAB      Ectopic      Multiple      Live Births  2           Family History  Problem Relation Age of Onset   Urolithiasis Mother    Hypertension Mother    Hyperlipidemia Mother    Cancer Maternal Aunt        breast   Diabetes Maternal Grandmother     Social History  Tobacco Use   Smoking status: Some Days    Pack years: 0.00   Smokeless tobacco: Never   Tobacco comments:    1-2 a day  Substance Use Topics   Alcohol use: Yes    Alcohol/week: 4.0 standard drinks    Types: 4 Cans of beer per week   Drug use: No    Home Medications Prior to Admission medications   Medication Sig Start Date End Date Taking? Authorizing Provider  amoxicillin-clavulanate (AUGMENTIN) 875-125 MG tablet Take 1 tablet by mouth 2 (two) times daily. One po bid x 7 days 10/10/20  Yes Sherwood Gambler, MD  doxycycline (VIBRAMYCIN) 100 MG capsule Take 1 capsule (100 mg total) by mouth 2 (two) times daily. One po bid x 7 days 10/10/20  Yes Sherwood Gambler, MD  naproxen (NAPROSYN) 375 MG tablet Take 1 tablet (375 mg total) by mouth 2 (two) times daily  with a meal. 10/10/20  Yes Sherwood Gambler, MD  oxyCODONE-acetaminophen (PERCOCET/ROXICET) 5-325 MG tablet Take 1 tablet by mouth every 6 (six) hours as needed for severe pain. 10/10/20  Yes Sherwood Gambler, MD  Beta Carotene (VITAMIN A) 25000 UNIT capsule Take 25,000 Units by mouth daily.    [provider]  cholecalciferol (VITAMIN D3) 25 MCG (1000 UNIT) tablet Take 1,000 Units by mouth daily.    [provider]  escitalopram (LEXAPRO) 5 MG tablet Take 1 tablet (5 mg total) by mouth daily. 10/07/20   Ronnell Freshwater, NP  hydrOXYzine (ATARAX/VISTARIL) 10 MG tablet Take 1 tablet (10 mg total) by mouth 2 (two) times daily as needed for anxiety. 10/07/20   Ronnell Freshwater, NP    Allergies    Stadol [butorphanol tartrate]  Review of Systems   Review of Systems  Constitutional:  Negative for fever.  Respiratory:  Negative for cough and shortness of breath.   Cardiovascular:  Positive for chest pain. Negative for leg swelling.  Gastrointestinal:  Negative for abdominal pain.  Musculoskeletal:  Negative for back pain.  All other systems reviewed and are negative.  Physical Exam Updated Vital Signs BP 102/74   Pulse 90   Temp 98.2 F (36.8 C) (Oral)   Resp 14   Ht 5\' 4"  (1.626 m)   Wt 74.4 kg   SpO2 98%   BMI 28.15 kg/m   Physical Exam Vitals and nursing note reviewed.  Constitutional:      General: She is in acute distress (in pain).     Appearance: She is well-developed. She is not diaphoretic.  HENT:     Head: Normocephalic and atraumatic.     Right Ear: External ear normal.     Left Ear: External ear normal.     Nose: Nose normal.  Eyes:     General:        Right eye: No discharge.        Left eye: No discharge.  Cardiovascular:     Rate and Rhythm: Regular rhythm. Tachycardia present.     Heart sounds: Normal heart sounds.  Pulmonary:     Effort: Pulmonary effort is normal.     Breath sounds: Normal breath sounds.  Chest:     Chest wall: No  tenderness.     Comments: No tenderness to the mid axillary line or back.  There is no rash. Abdominal:     Palpations: Abdomen is soft.     Tenderness: There is no abdominal tenderness.  Skin:    General: Skin is warm and dry.  Neurological:     Mental Status: She is alert.  Psychiatric:        Mood and Affect: Mood is not anxious.    ED Results / Procedures / Treatments   Labs (all labs ordered are listed, but only abnormal results are displayed) Labs Reviewed  BASIC METABOLIC PANEL - Abnormal; Notable for the following components:      Result Value   Glucose, Bld 100 (*)    All other components within normal limits  CBC  PROTIME-INR  I-STAT BETA HCG BLOOD, ED ([facility], [facility], [facility] ONLY)  TROPONIN I (HIGH SENSITIVITY)  TROPONIN I (HIGH SENSITIVITY)    EKG EKG Interpretation  Date/Time:  Friday October 10 2020 10:30:55 EDT Ventricular Rate:  87 PR Interval:  124 QRS Duration: 101 QT Interval:  368 QTC Calculation: 443 R Axis:   37 Text Interpretation: Sinus rhythm Low voltage, precordial leads no acute ST/T changes previous nonspecific findings likely technique rather than ischemia Confirmed by Sherwood Gambler [phone]) on 10/10/2020 11:32:54 AM  Radiology DG Chest 2 View  Result Date: 10/10/2020 CLINICAL DATA:  Lung cancer, chest pain, shortness of breath EXAM: CHEST - 2 VIEW COMPARISON:  09/15/2020, 09/01/2020 FINDINGS: Lower lung volumes with minor basilar atelectasis. Rounded left hilar mass, overall stable in size and better demonstrated by chest CT comparison. No superimposed acute pneumonia, significant collapse or consolidation. No effusion or pneumothorax. Trachea midline. Monitor leads overlie the chest. IMPRESSION: Stable left hilar mass. Low lung volumes and basilar atelectasis. No significant interval change or acute process by plain radiography Electronically Signed   By: Jerilynn Mages.  Shick M.D.   On: 10/10/2020 10:25   CT Angio Chest PE W and/or Wo Contrast  Result Date:  10/10/2020 CLINICAL DATA:  Chest pain shortness of breath recent diagnosis of lung cancer 10/03/2020 EXAM: CT ANGIOGRAPHY CHEST WITH CONTRAST TECHNIQUE: Multidetector CT imaging of the chest was performed using the standard protocol during bolus administration of intravenous contrast. Multiplanar CT image reconstructions and MIPs were obtained to evaluate the vascular anatomy. CONTRAST:  66mL OMNIPAQUE IOHEXOL 350 MG/ML SOLN COMPARISON:  08/15/2020 FINDINGS: Cardiovascular: Intact thoracic aorta. Negative for aneurysm or dissection. No mediastinal hemorrhage or hematoma. Pulmonary arteries appear patent and normal in caliber. Negative for significant acute filling defect or pulmonary embolus by CTA. Normal heart size.  No pericardial effusion. Central venous structures are patent.  No veno-occlusive process. Mediastinum/Nodes: Thyroid unremarkable. Trachea and central airways are patent. Esophagus nondilated. No hiatal hernia. No supraclavicular or axillary adenopathy. Redemonstration of bulky subcarinal and left hilar adenopathy. Subcarinal nodal mass has enlarged measuring 3.2 cm, previously 2.3 cm. Left hilar nodal mass also enlarged measuring 3.3 cm, previously 3 cm. Slight enlargement of contralateral right hilar lymph nodes now measuring up to 12 mm. Findings compatible with progression of nodal metastatic disease. Lungs/Pleura: Stable left upper lobe subpleural fissural mass measuring 17 x 10 mm. Stable adjacent left upper lobe nodule medially measuring 10 mm. Increased inferior lingula and left lower lobe patchy basilar airspace process. Difficult to exclude pneumonia. Trace right base atelectasis. Small left effusion noted. Upper Abdomen: No acute upper abdominal finding. Musculoskeletal: No acute osseous finding. Review of the MIP images confirms the above findings. IMPRESSION: Negative for significant acute pulmonary embolus by CTA. Intact aorta without aneurysm or dissection. No acute intrathoracic  vascular finding. Slight interval progression and enlargement of the bulky subcarinal and left hilar adenopathy. 2 stable adjacent left upper lobe pulmonary nodules. New inferior lingula and left lower lobe patchy airspace process  and trace left effusion concerning for left basilar pneumonia. Trace right base atelectasis. Electronically Signed   By: Jerilynn Mages.  Shick M.D.   On: 10/10/2020 11:42    Procedures Procedures   Medications Ordered in ED Medications  sodium chloride (PF) 0.9 % injection (has no administration in time range)  sodium chloride flush (NS) 0.9 % injection 3 mL (3 mLs Intravenous Given 10/10/20 0930)  sodium chloride 0.9 % bolus 1,000 mL (0 mLs Intravenous Stopped 10/10/20 1129)  fentaNYL (SUBLIMAZE) injection 100 mcg (100 mcg Intravenous Given 10/10/20 0935)  ketorolac (TORADOL) 15 MG/ML injection 15 mg (15 mg Intravenous Given 10/10/20 1129)  iohexol (OMNIPAQUE) 350 MG/ML injection 80 mL (75 mLs Intravenous Contrast Given 10/10/20 1056)  amoxicillin-clavulanate (AUGMENTIN) 875-125 MG per tablet 1 tablet (1 tablet Oral Given 10/10/20 1328)  doxycycline (VIBRA-TABS) tablet 100 mg (100 mg Oral Given 10/10/20 1328)    ED Course  I have reviewed the triage vital signs and the nursing notes.  Pertinent labs & imaging results that were available during my care of the patient were reviewed by me and considered in my medical decision making (see chart for details).    MDM Rules/Calculators/A&P                          Patient's chest pain has dramatically improved after being given Toradol.  The fentanyl only sort of helped.  Overall it seems like she has some mild pneumonia on her CT scan.  CT was obtained to help rule out PE given higher concern given active cancer.  Fortunately there is no blood clot.  Troponins are negative x2 and ECG is overall unremarkable.  Unclear if this is directly related to a recent bronc but that was about a month ago.  Given this, I think she is stable for outpatient  antibiotics and pain control.  She has been prescribed Lexapro but not yet started and given she is not sure if she wants to start this I have told her to hold off while she is going to be on NSAIDs for this type of pain.  Follow-up with PCP and we discussed return precautions. Final Clinical Impression(s) / ED Diagnoses Final diagnoses:  Pneumonia of left lower lobe due to infectious organism    Rx / DC Orders ED Discharge Orders          Ordered    amoxicillin-clavulanate (AUGMENTIN) 875-125 MG tablet  2 times daily        10/10/20 1310    doxycycline (VIBRAMYCIN) 100 MG capsule  2 times daily        10/10/20 1310    naproxen (NAPROSYN) 375 MG tablet  2 times daily with meals        10/10/20 1310    oxyCODONE-acetaminophen (PERCOCET/ROXICET) 5-325 MG tablet  Every 6 hours PRN        10/10/20 1310             Sherwood Gambler, MD 10/10/20 1530

## 2020-10-10 NOTE — Telephone Encounter (Signed)
Received a Mychart message from patient stating that she was in the ED this morning and was diagnosed with PNA. She stated that she was at home resting now but just wanted to let Dr. Lamonte Sakai know.   Will forward to Dr. Lamonte Sakai as a FYI.

## 2020-10-10 NOTE — ED Triage Notes (Signed)
Pt presents to ED cc cp, sob. Pt states onset of sharp, stabbing leftsided cp approx 1 hr ago. Pt states pain woke her from her sleep. Pt noted to be restless. Pt states recent dx of lung cancer (10/03/20). Per husband at bedside, pt has intermittent left sided cp for the past 2 days. Respirations equal, tachy, sob. A&Ox4.

## 2020-10-10 NOTE — Telephone Encounter (Signed)
Pts husband LM stating pt was experiencing sharp CP and SOB and going to the ER. He request a call back.  I have attempted to call pts husband back. There was no answer and unable to LM.

## 2020-10-10 NOTE — Discharge Instructions (Addendum)
If you develop high fever, severe cough or cough with blood, trouble breathing, severe headache, neck pain/stiffness, vomiting, or any other new/concerning symptoms then return to the ER for evaluation   Do NOT start your escitalopram/Lexapro until after you finish the anti-inflammatories given.  You are being prescribed naproxen, do not take Ibuprofen/Advil/Aleve/Motrin/Goody Powders/BC powders/Meloxicam/Diclofenac/Indomethacin and other Nonsteroidal anti-inflammatory medications

## 2020-10-14 ENCOUNTER — Telehealth: Payer: Self-pay | Admitting: *Deleted

## 2020-10-14 NOTE — Telephone Encounter (Signed)
I received a message from Dr. Worthy Flank desk nurse that [name] had called. I called her and spoke to her.  She was checking to see if [name] had updated on an appt for her. I called Dr. Florentina Jenny office but was unable to reach.  I did leave vm message to have someone call me with an update on appt for [name]. I updated patient on the information.  I listened as she explained she was recently in the hospital due to pneumonia.  I told her I am thinking about her and will call her when I know more from [place].  She was thankful for the call.

## 2020-10-14 NOTE — Telephone Encounter (Signed)
I received a call back from [facility] updating me that they will contact Donna Higgins by the end of the week for an appt. I called to update patient. I was unable to reach but did leave vm message with an update, my name and phone number to call.

## 2020-10-15 LAB — FUNGUS CULTURE RESULT

## 2020-10-15 LAB — FUNGAL ORGANISM REFLEX

## 2020-10-15 LAB — FUNGUS CULTURE WITH STAIN

## 2020-10-16 ENCOUNTER — Other Ambulatory Visit: Payer: Self-pay | Admitting: Nurse Practitioner

## 2020-10-16 DIAGNOSIS — F411 Generalized anxiety disorder: Secondary | ICD-10-CM

## 2020-10-16 MED ORDER — ALPRAZOLAM 0.25 MG PO TABS: 0.2500 mg | ORAL_TABLET | Freq: Two times a day (BID) | ORAL | 1 refills | Status: DC | PRN

## 2020-10-16 NOTE — Progress Notes (Signed)
Send prescription for alprazolam 0.25 mg tablets.  May take this up to twice daily as needed for acute anxiety.  Recommend he use in the evening for nighttime only until you know how the medication will affect you.  May add second dose during the day if no drowsiness or dizziness occurs.  Always take caution as these side effects may occur at any time.  I sent this to [facility].

## 2020-10-17 ENCOUNTER — Telehealth: Payer: Self-pay | Admitting: Medical Oncology

## 2020-10-17 NOTE — Telephone Encounter (Signed)
Pt waiting on appt from [name]> . I emailed [employer].

## 2020-10-20 NOTE — Telephone Encounter (Signed)
Thank you for letting me know

## 2020-10-22 ENCOUNTER — Other Ambulatory Visit: Payer: Self-pay

## 2020-10-22 ENCOUNTER — Ambulatory Visit (HOSPITAL_COMMUNITY)
Admission: RE | Admit: 2020-10-22 | Discharge: 2020-10-22 | Disposition: A | Discharge status: Home / Self Care | Payer: BC Managed Care – PPO | Source: Ambulatory Visit | Attending: Internal Medicine | Admitting: Internal Medicine

## 2020-10-22 DIAGNOSIS — C7A090 Malignant carcinoid tumor of the bronchus and lung: Secondary | ICD-10-CM | POA: Insufficient documentation

## 2020-10-22 IMAGING — CT NM PET SKULL BASE TO THIGH
1 of 7 series · 1 of 25 positions shown · non-contrast
Comparison: None.

CLINICAL DATA: Pulmonary carcinoid.

EXAM:
NUCLEAR MEDICINE PET SKULL BASE TO THIGH
TECHNIQUE: 4.2 mCi gallium 68 DOTATATE was injected intravenously. Full-ring
PET imaging was performed from the skull base to thigh after the
radiotracer. CT data was obtained and used for attenuation
correction and anatomic localization.

[Series 4: ct sk_thigh 5.0 bf37 · axial · 5.0mm · 0.98mm/px · 1 of 220 slices shown]
[im 220/220  brain]
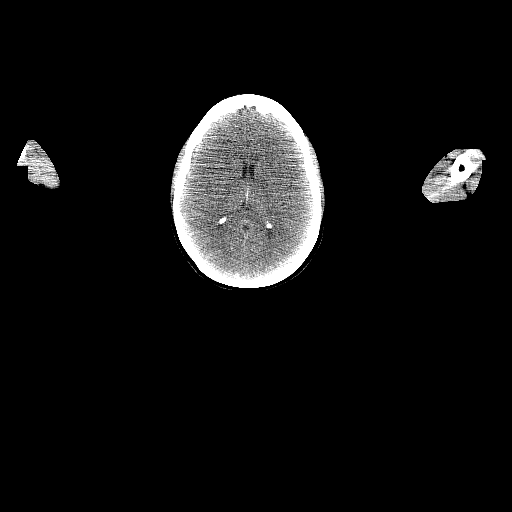

[1 of 25 positions shown; findings below may reference images not displayed]

FINDINGS: NECK

No radiotracer activity in neck lymph nodes.

Incidental CT findings: None

CHEST

Elongated nodule in the LEFT upper lobe has low radiotracer activity
SUV max equal 1.9 (image 73).

Enlarged LEFT hilar node/mass measuring 2.6 cm with low radiotracer
activity (SUV max equal 4.5). Similar subcarinal node measuring
cm has relatively low radiotracer activity SUV max

The low activity is compared to physiologic liver activity with SUV
max equal 9.8.

Incidental CT finding:None

ABDOMEN/PELVIS

No abnormal radiotracer activity in the abdomen pelvis. No liver
metastasis

Physiologic activity noted in the liver, spleen, adrenal glands and
kidneys.

Incidental CT findings:None

SKELETON

No focal activity to suggest skeletal metastasis.

Incidental CT findings:None
IMPRESSION: 1. The LEFT upper lobe nodule, LEFT hilar nodule and subcarinal
adenopathy somatostatin receptor radiotracer activity consistent
with neuroendocrine tumor. However the activity is relatively low
(less than physiologic liver activity -AIKEN score 1). Conversely
the lesions have relatively high FDG metabolic activity. This would
indicate a neuroendocrine tumor towards the less well differentiated
side of the spectrum.
2. No evidence of metastatic disease outside the thorax

## 2020-10-22 MED ORDER — GALLIUM GA 68 DOTATATE IV KIT
4.2000 | PACK | Freq: Once | INTRAVENOUS | Status: AC | PRN
  Administered 2020-10-22: 4.2 via INTRAVENOUS

## 2020-10-31 LAB — ACID FAST CULTURE WITH REFLEXED SENSITIVITIES (MYCOBACTERIA): Acid Fast Culture: NEGATIVE

## 2020-12-05 HISTORY — PX: LUNG SURGERY: SHX703

## 2020-12-12 ENCOUNTER — Other Ambulatory Visit: Payer: Self-pay | Admitting: Nurse Practitioner

## 2020-12-12 ENCOUNTER — Encounter: Payer: Self-pay | Admitting: Nurse Practitioner

## 2020-12-12 DIAGNOSIS — F411 Generalized anxiety disorder: Secondary | ICD-10-CM

## 2020-12-16 ENCOUNTER — Other Ambulatory Visit: Payer: Self-pay | Admitting: Nurse Practitioner

## 2020-12-16 NOTE — Progress Notes (Signed)
Discontinued lexapro as patient is not taking. Patet will need to let [facility] know that this medication is no longer being prescribed.

## 2020-12-29 ENCOUNTER — Telehealth: Payer: Self-pay | Admitting: Neurology

## 2020-12-29 NOTE — Telephone Encounter (Signed)
Patient called said her vision in her right eye is having problems with blurriness again.  She'd like Dr. Georgie Chard recommendation on what to do.

## 2020-12-30 ENCOUNTER — Telehealth: Payer: Self-pay | Admitting: Pharmacy Technician

## 2020-12-30 ENCOUNTER — Other Ambulatory Visit: Payer: Self-pay | Admitting: Pharmacy Technician

## 2020-12-30 ENCOUNTER — Encounter: Payer: Self-pay | Admitting: Neurology

## 2020-12-30 NOTE — Telephone Encounter (Signed)
Order for Solu-Medrol sent to the [facility].  Message sent to the Dr. Lorin Picket. Pt has an appt for next Tuesday for F/U on Infusion.

## 2020-12-30 NOTE — Telephone Encounter (Signed)
PER DR.Jaffe, I would set her up for Solu-Medrol 1000mg  IV daily for 3 days.  We put her on the waiting list back in July.  I will need to see her ASAP to get an update on her other health issues as well.

## 2020-12-30 NOTE — Telephone Encounter (Signed)
Dr. Tomi Likens,  Auth Submission: NO AUTH REQUIRED Payer: [employer]BS Medication & CPT/J Code(s) submitted: Solumedrol (Methylprednisolone) J2930 Route of submission (phone, fax, portal): PHONE Auth type: Buy/Bill Units/visits requested: 3 Reference number: [id number]  We will schedule patient asap.  Patient has met $1250 deductible. Patient has met $4890 out of pocket.

## 2020-12-30 NOTE — Telephone Encounter (Signed)
Telephone call to pt, Symptoms started again yesterday.  Aug 26 th Full left lung removed.   Pt started Gabapentin TID, as needed.   Please advise

## 2020-12-31 ENCOUNTER — Telehealth: Payer: Self-pay | Admitting: Neurology

## 2020-12-31 ENCOUNTER — Ambulatory Visit: Payer: BC Managed Care – PPO

## 2020-12-31 ENCOUNTER — Ambulatory Visit (INDEPENDENT_AMBULATORY_CARE_PROVIDER_SITE_OTHER): Payer: BC Managed Care – PPO

## 2020-12-31 ENCOUNTER — Other Ambulatory Visit: Payer: Self-pay

## 2020-12-31 VITALS — BP 109/76 | HR 76 | Temp 98.5°F | Resp 16 | Ht 64.0 in | Wt 171.0 lb

## 2020-12-31 DIAGNOSIS — G35 Multiple sclerosis: Secondary | ICD-10-CM | POA: Diagnosis not present

## 2020-12-31 MED ORDER — SODIUM CHLORIDE 0.9 % IV SOLN: Freq: Once | INTRAVENOUS | Status: DC | PRN

## 2020-12-31 MED ORDER — SODIUM CHLORIDE 0.9% FLUSH: 10.0000 mL | Freq: Once | INTRAVENOUS | Status: DC | PRN

## 2020-12-31 MED ORDER — EPINEPHRINE 0.3 MG/0.3ML IJ SOAJ: 0.3000 mg | Freq: Once | INTRAMUSCULAR | Status: DC | PRN

## 2020-12-31 MED ORDER — SODIUM CHLORIDE 0.9% FLUSH: 3.0000 mL | Freq: Once | INTRAVENOUS | Status: DC | PRN

## 2020-12-31 MED ORDER — DIPHENHYDRAMINE HCL 50 MG/ML IJ SOLN: 50.0000 mg | Freq: Once | INTRAMUSCULAR | Status: DC | PRN

## 2020-12-31 MED ORDER — HEPARIN SOD (PORK) LOCK FLUSH 100 UNIT/ML IV SOLN: 250.0000 [IU] | Freq: Once | INTRAVENOUS | Status: DC | PRN

## 2020-12-31 MED ORDER — FAMOTIDINE IN NACL 20-0.9 MG/50ML-% IV SOLN: 20.0000 mg | Freq: Once | INTRAVENOUS | Status: DC | PRN

## 2020-12-31 MED ORDER — ANTICOAGULANT SODIUM CITRATE 4% (200MG/5ML) IV SOLN: 5.0000 mL | Freq: Once | Status: DC | PRN

## 2020-12-31 MED ORDER — ALTEPLASE 2 MG IJ SOLR: 2.0000 mg | Freq: Once | INTRAMUSCULAR | Status: DC | PRN

## 2020-12-31 MED ORDER — METHYLPREDNISOLONE SODIUM SUCC 125 MG IJ SOLR: 125.0000 mg | Freq: Once | INTRAMUSCULAR | Status: DC | PRN

## 2020-12-31 MED ORDER — ALBUTEROL SULFATE HFA 108 (90 BASE) MCG/ACT IN AERS: 2.0000 | INHALATION_SPRAY | Freq: Once | RESPIRATORY_TRACT | Status: DC | PRN

## 2020-12-31 MED ORDER — HEPARIN SOD (PORK) LOCK FLUSH 100 UNIT/ML IV SOLN: 500.0000 [IU] | Freq: Once | INTRAVENOUS | Status: DC | PRN

## 2020-12-31 MED ORDER — METHYLPREDNISOLONE SODIUM SUCC 1000 MG IJ SOLR
1000.0000 mg | Freq: Once | INTRAMUSCULAR | Status: AC
  Administered 2020-12-31: 1000 mg via INTRAVENOUS
  Filled 2020-12-31: qty 16

## 2020-12-31 NOTE — Telephone Encounter (Signed)
Advised pt , Dr.Jaffe want to try Acthar Gel instead. It not a Steroid so she should be able to take it for a MS flare.  Please call you Surgeon to advised him that DR.Jaffe want to try Acthar and let us know what he advise [place].   Also calling rep for Acthar to get samples for patient.

## 2020-12-31 NOTE — Progress Notes (Signed)
Diagnosis: Multiple Sclerosis  Provider:  Marshell Garfinkel, MD  Procedure: Infusion  IV Type: Peripheral, IV Location: R Antecubital  Rituxan (Rituximab), Solumedrol (Methylprednisolone), Dose: 1000 mg  Infusion Start Time: 1410  Infusion Stop Time: 7185  Post Infusion IV Care: IV discontinued  Discharge: Condition: Good, Destination: Home . AVS provided to patient.   Performed by:  Preethi Scantlebury, Sherlon Handing, LPN

## 2020-12-31 NOTE — Telephone Encounter (Signed)
Pt called in, she spoke with her surgeon, he said for the first 90 days she should not have any steroids. So she is wanting to know what else can be done

## 2020-12-31 NOTE — Telephone Encounter (Signed)
Pt called in stated that her Surgeon stated she could do the steroid because of her about to loose vision. Pt called asking if Dr Tomi Likens still ok with her doing the infusion, Dr Tomi Likens was called. Per Dr Tomi Likens he stated yes he wants pt to do the infusions to see if it helps her , pt is scheduled to see Dr Tomi Likens on wed 01/07/21 at 11:50

## 2021-01-01 ENCOUNTER — Ambulatory Visit (INDEPENDENT_AMBULATORY_CARE_PROVIDER_SITE_OTHER): Payer: BC Managed Care – PPO

## 2021-01-01 ENCOUNTER — Ambulatory Visit: Payer: BC Managed Care – PPO

## 2021-01-01 VITALS — BP 112/76 | HR 70 | Temp 98.4°F | Resp 16 | Ht 64.0 in | Wt 172.2 lb

## 2021-01-01 DIAGNOSIS — G35 Multiple sclerosis: Secondary | ICD-10-CM

## 2021-01-01 MED ORDER — SODIUM CHLORIDE 0.9 % IV SOLN
1000.0000 mg | Freq: Once | INTRAVENOUS | Status: AC
  Administered 2021-01-01: 1000 mg via INTRAVENOUS
  Filled 2021-01-01: qty 16

## 2021-01-01 NOTE — Progress Notes (Signed)
Diagnosis: Multiple Sclerosis  Provider:  Marshell Garfinkel, MD  Procedure: Infusion  IV Type: Peripheral, IV Location: R Hand  Solumedrol (Methylprednisolone), Dose: 1000 mg  Infusion Start Time: 10.18  01/01/2021  Infusion Stop Time: 11.42 01/01/2021  Post Infusion IV Care: Peripheral IV Discontinued  Discharge: Condition: Good, Destination: Home . AVS provided to patient.   Performed by:  Arnoldo Morale, RN

## 2021-01-02 ENCOUNTER — Other Ambulatory Visit: Payer: Self-pay

## 2021-01-02 ENCOUNTER — Ambulatory Visit (INDEPENDENT_AMBULATORY_CARE_PROVIDER_SITE_OTHER): Payer: BC Managed Care – PPO

## 2021-01-02 ENCOUNTER — Ambulatory Visit: Payer: BC Managed Care – PPO

## 2021-01-02 VITALS — BP 120/81 | HR 64 | Temp 98.5°F | Resp 18 | Wt 172.0 lb

## 2021-01-02 DIAGNOSIS — G35 Multiple sclerosis: Secondary | ICD-10-CM

## 2021-01-02 MED ORDER — HEPARIN SOD (PORK) LOCK FLUSH 100 UNIT/ML IV SOLN: 250.0000 [IU] | Freq: Once | INTRAVENOUS | Status: DC | PRN

## 2021-01-02 MED ORDER — FAMOTIDINE IN NACL 20-0.9 MG/50ML-% IV SOLN: 20.0000 mg | Freq: Once | INTRAVENOUS | Status: DC | PRN

## 2021-01-02 MED ORDER — SODIUM CHLORIDE 0.9 % IV SOLN
1000.0000 mg | Freq: Once | INTRAVENOUS | Status: AC
  Administered 2021-01-02: 1000 mg via INTRAVENOUS
  Filled 2021-01-02: qty 16

## 2021-01-02 MED ORDER — SODIUM CHLORIDE 0.9 % IV SOLN: Freq: Once | INTRAVENOUS | Status: DC | PRN

## 2021-01-02 MED ORDER — ALBUTEROL SULFATE HFA 108 (90 BASE) MCG/ACT IN AERS: 2.0000 | INHALATION_SPRAY | Freq: Once | RESPIRATORY_TRACT | Status: DC | PRN

## 2021-01-02 MED ORDER — SODIUM CHLORIDE 0.9% FLUSH: 3.0000 mL | Freq: Once | INTRAVENOUS | Status: DC | PRN

## 2021-01-02 MED ORDER — ANTICOAGULANT SODIUM CITRATE 4% (200MG/5ML) IV SOLN
5.0000 mL | Freq: Once | Status: DC | PRN
  Filled 2021-01-02: qty 5

## 2021-01-02 MED ORDER — ALTEPLASE 2 MG IJ SOLR: 2.0000 mg | Freq: Once | INTRAMUSCULAR | Status: DC | PRN

## 2021-01-02 MED ORDER — DIPHENHYDRAMINE HCL 50 MG/ML IJ SOLN: 50.0000 mg | Freq: Once | INTRAMUSCULAR | Status: DC | PRN

## 2021-01-02 MED ORDER — HEPARIN SOD (PORK) LOCK FLUSH 100 UNIT/ML IV SOLN: 500.0000 [IU] | Freq: Once | INTRAVENOUS | Status: DC | PRN

## 2021-01-02 MED ORDER — SODIUM CHLORIDE 0.9% FLUSH: 10.0000 mL | Freq: Once | INTRAVENOUS | Status: DC | PRN

## 2021-01-02 MED ORDER — EPINEPHRINE 0.3 MG/0.3ML IJ SOAJ: 0.3000 mg | Freq: Once | INTRAMUSCULAR | Status: DC | PRN

## 2021-01-02 MED ORDER — METHYLPREDNISOLONE SODIUM SUCC 125 MG IJ SOLR: 125.0000 mg | Freq: Once | INTRAMUSCULAR | Status: DC | PRN

## 2021-01-02 NOTE — Progress Notes (Signed)
Diagnosis: Multiple Sclerosis  Provider:  Marshell Garfinkel, MD  Procedure: Infusion  IV Type: Peripheral, IV Location: R Antecubital  Solumedrol (Methylprednisolone), Dose: 1000 mg  Infusion Start Time: 1001  Infusion Stop Time: 1023  Post Infusion IV Care: Observation period completed  Discharge: Condition: Good, Destination: Home . AVS provided to patient.   Performed by:  Charlie Pitter, RN

## 2021-01-06 NOTE — Progress Notes (Signed)
NEUROLOGY FOLLOW UP OFFICE NOTE  Donna Higgins [id number]  Assessment/Plan:   Relapsing-remitting Multiple sclerosis - there is a small question regarding whether the optic neuritis represented paraneoplastic syndrome.  We discussed possibly repeating an LP to look for cytology and paraneoplastic panel.  She has been through a lot and would like to avoid further testing if possible.  I agree as my suspicion for paraneoplastic syndrome is low as MRI findings are consistent with MS and given that the MRI without leptomeningeal enhancement, no evidence of metastasis and I would have suspected that she would have clinically declined.  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  Will check CBC w/diff, CMP, vit D and JCV ab with index, repeat labs again in 6 months. Repeat MRI of brain/cervical/thoracic spine with and without contrast to establish new baseline prior to initiating DMT Continue vit D3 4000 IU daily Follow up 6 months or sooner if needed.   Subjective:  Donna Higgins is a 40 year old right-handed female who follows up for multiple sclerosis.  Oncology and pulmonology notes reviewed.  UPDATE: Plan was to start either Tysabri or Ocrevus.  Labs in April demonstrated negative JCV ab.  IgA was 113, IgG 876 and IgM 88.  Vit D level was 29.10 and she was advised to start D3 4000 IU daily.  She followed up with pulmonology regarding the lung nodule and was subsequently diagnosed with neuroendocrine carcinoma of the lung.  Initiation of DMT was put on-hold.  She followed up with oncology and workup for metastatic disease (including PET) was negative.  Repeat MRI of brain with and without contrast on 10/06/2020 personally reviewed showed evidence of chronic demyelinating  lesions but no leptomeningeal enhancement or other evidence of metastatic disease.  She underwent a left pneumonectomy on 12/05/2020.  Margins are all clear and she has been determined to be cancer-free with no oncology follow up needed.  Immunosuppressant therapy such as chemo, not indicated.  However, last week, she developed recurrence of blurred vision in right eye.  Due to recent surgery, her surgeon cautioned her about using steroids.  She was explained the risks of IV steroids soon after surgery but weighing risks and benefits, she underwent 3 days of IV Solum-Medrol; 1mg  daily due to risk of permanent vision loss.  She continues to have blurred vision in the right eye, albeit slightly improved.  She has appointment with ophthalmology in November.    HISTORY: She saw her ophthalmologist on 07/28/2020 for three days of worsening blurred vision and pressure in her right eye.  No headache or ocular pain.  Her ophthalmologist noted possible optic neuritis and sent her to the ED for imaging.  She was seen in the ED at [facility] where MRI of brain and orbits with and without contrast showed several hyperintense foci in the cerebral white matter bilaterally, most notable in the right frontal and temporoparietal regions but no abnormal enhancement.  There was also mild asymmetric edema and enhancement of the right optic nerve.  She was transferred to [facility] for management where she received 3 day course of Solu-Medrol 1gm for 3 days.  MRI of cervical and thoracic spine with and without contrast showed small non-enhancing lesion at  C2 level and T11 level.   She underwent LP which demonstrated CSF cell count 0, protein 31, glucose 106, IgG 2.7, negative culture, negative measles/rubella/mumps/VZV/HSV 1&2/West Nile , ACE negative, nonreactive VDRL, negative Lyme.  Serum labs included negative NMO/AQP4, negative anti-MOG, sed rate 1, CRP <3, non-reactive RPR, negative HIV, B12  437, folate 8.2, TSH 0.13, and negative acute viral hepatitis panel (HAV, HBV, HCV).  CBC with WBC 10.1, HGB 12.7, HCT 38.6, PLT 342, ALC 3; CMP with Na 140, K 3.8, Cl 106, CO2 23, glucose 93, BUN 13, Cr 0.66, Ca 9.4, ALP 66, t bili 0.26, AST 16, ALT <5.  She was discharged on prednisone taper.  Monday morning is last dose of prednisone.  Still with mild blurred but significantly improved.  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     Denies history of other neurologic conditions such as migraine.  No known family history of MS.  Her father was adopted.    PAST MEDICAL HISTORY: Past Medical History:  Diagnosis Date   Decreased libido    History of chicken pox    Internal hemorrhoids without mention of complication    Neuromuscular disorder ([place])    [employer] 08/05/2020, dx MS in April    MEDICATIONS: Current Outpatient Medications on File Prior to Visit  Medication Sig Dispense Refill   ALPRAZolam (XANAX) 0.25 MG tablet Take 1 tablet (0.25 mg total) by mouth 2 (two) times daily as needed for anxiety. 45 tablet 1   amoxicillin-clavulanate (AUGMENTIN) 875-125 MG tablet Take 1 tablet by mouth 2 (two) times daily. One po bid x 7 days 13 tablet 0   Beta Carotene (VITAMIN A) 25000 UNIT capsule Take 25,000 Units by mouth daily.     cholecalciferol (VITAMIN D3) 25 MCG (1000 UNIT) tablet Take 1,000 Units by mouth daily.     doxycycline (VIBRAMYCIN) 100 MG capsule Take 1 capsule (100 mg total) by mouth 2 (two) times daily. One po bid x 7 days 13 capsule 0   hydrOXYzine (ATARAX/VISTARIL) 10 MG tablet Take 1 tablet (10 mg total) by mouth 2 (two) times daily as needed for anxiety. 45 tablet 1   naproxen (NAPROSYN) 375 MG tablet Take 1 tablet (375 mg total) by mouth 2 (two) times daily with a meal. 20 tablet 0   oxyCODONE-acetaminophen (PERCOCET/ROXICET) 5-325 MG tablet Take 1 tablet by mouth  every 6 (six) hours as needed for severe pain. 10 tablet 0   No current facility-administered medications on file prior to visit.    ALLERGIES: Allergies  Allergen Reactions   Stadol [Butorphanol Tartrate]     Legs twitching, nausea    FAMILY HISTORY: Family History  Problem Relation Age of Onset   Urolithiasis Mother    Hypertension Mother    Hyperlipidemia Mother    Cancer Maternal Aunt        breast   Diabetes Maternal Grandmother       Objective:  Blood pressure 96/66, pulse (!) 109, height 5\' 4"  (1.626 m), weight 172 lb (78 kg), SpO2 98 %. General: No acute distress.  Patient appears well-groomed.   Head:  Normocephalic/atraumatic Eyes:  Fundi examined but not visualized Neck: supple, no paraspinal tenderness, full range of motion Heart:  Regular rate and rhythm Lungs:  Clear to auscultation bilaterally Back: No paraspinal tenderness Neurological Exam: alert and oriented to person, place, and time.  Speech  fluent and not dysarthric, language intact.  Right APD.  Otherwise, CN II-XII intact. Bulk and tone normal, muscle strength 5/5 throughout.  Sensation to light touch intact.  Deep tendon reflexes 2+ throughout, toes downgoing.  Finger to nose testing intact.  Gait normal, Romberg negative.   Metta Clines, DO  CC: Leretha Pol, NP

## 2021-01-07 ENCOUNTER — Ambulatory Visit: Payer: BC Managed Care – PPO | Admitting: Neurology

## 2021-01-07 ENCOUNTER — Encounter: Payer: Self-pay | Admitting: Neurology

## 2021-01-07 ENCOUNTER — Other Ambulatory Visit: Payer: Self-pay

## 2021-01-07 VITALS — BP 96/66 | HR 109 | Ht 64.0 in | Wt 172.0 lb

## 2021-01-07 DIAGNOSIS — C7A8 Other malignant neuroendocrine tumors: Secondary | ICD-10-CM

## 2021-01-07 DIAGNOSIS — G35 Multiple sclerosis: Secondary | ICD-10-CM | POA: Diagnosis not present

## 2021-01-07 DIAGNOSIS — H469 Unspecified optic neuritis: Secondary | ICD-10-CM

## 2021-01-07 NOTE — Patient Instructions (Addendum)
Plan is to start Tysabri (run it by the surgeon and let me know)- check CBC with diff, CMP, vit D and JC Virus antibody with index now and again in 6 months Repeat MRI of brain/cervical/thoracic spine with and without contrast to establish new baseline Continue vitamin D3 4000 IU daily  Follow up in 6 months or sooner if needed. We have sent a referral to [facility] for your MRI and they will call you directly to schedule your appointment. They are located at [place]. If you need to contact them directly please call [phone].   Your provider has requested that you have labwork completed today. Please go to [facility] ([id number]) on the second floor of this building before leaving the office today. You do not need to check in. If you are not called within 15 minutes please check with the front desk.

## 2021-01-08 ENCOUNTER — Telehealth: Payer: Self-pay

## 2021-01-08 NOTE — Telephone Encounter (Signed)
New message   Donna Higgins (Key: [id number]) TYSABRI (natalizumab) 300MG /15ML concentrate   Form Charity fundraiser PA Form (2017 NCPDP) Created 35 minutes ago Sent to Plan 2 minutes ago Determination Wait for Questions [employer] NCPDP 2017 typically responds with questions in less than 15 minutes, but may take up to 24 hours.

## 2021-01-12 NOTE — Telephone Encounter (Signed)
F/u   Message from Plan Your PA request has been denied. Additional information will be provided in the denial communication. (Message [phone])

## 2021-01-13 ENCOUNTER — Other Ambulatory Visit: Payer: Self-pay

## 2021-01-14 ENCOUNTER — Other Ambulatory Visit: Payer: Self-pay

## 2021-01-14 ENCOUNTER — Other Ambulatory Visit (INDEPENDENT_AMBULATORY_CARE_PROVIDER_SITE_OTHER): Payer: BC Managed Care – PPO

## 2021-01-14 DIAGNOSIS — C7A8 Other malignant neuroendocrine tumors: Secondary | ICD-10-CM

## 2021-01-14 DIAGNOSIS — G35 Multiple sclerosis: Secondary | ICD-10-CM | POA: Diagnosis not present

## 2021-01-14 DIAGNOSIS — H469 Unspecified optic neuritis: Secondary | ICD-10-CM

## 2021-01-14 LAB — CBC WITH DIFFERENTIAL/PLATELET
Basophils Absolute: 0 10*3/uL (ref 0.0–0.1)
Basophils Relative: 0.6 % (ref 0.0–3.0)
Eosinophils Absolute: 0.2 10*3/uL (ref 0.0–0.7)
Eosinophils Relative: 3.1 % (ref 0.0–5.0)
HCT: 41 % (ref 36.0–46.0)
Hemoglobin: 13.5 g/dL (ref 12.0–15.0)
Lymphocytes Relative: 29.5 % (ref 12.0–46.0)
Lymphs Abs: 2.4 10*3/uL (ref 0.7–4.0)
MCHC: 32.9 g/dL (ref 30.0–36.0)
MCV: 88.6 fl (ref 78.0–100.0)
Monocytes Absolute: 0.8 10*3/uL (ref 0.1–1.0)
Monocytes Relative: 10 % (ref 3.0–12.0)
Neutro Abs: 4.5 10*3/uL (ref 1.4–7.7)
Neutrophils Relative %: 56.8 % (ref 43.0–77.0)
Platelets: 264 10*3/uL (ref 150.0–400.0)
RBC: 4.62 Mil/uL (ref 3.87–5.11)
RDW: 13.4 % (ref 11.5–15.5)
WBC: 8 10*3/uL (ref 4.0–10.5)

## 2021-01-14 LAB — COMPREHENSIVE METABOLIC PANEL
ALT: 58 U/L — ABNORMAL HIGH (ref 0–35)
AST: 36 U/L (ref 0–37)
Albumin: 4.2 g/dL (ref 3.5–5.2)
Alkaline Phosphatase: 65 U/L (ref 39–117)
BUN: 16 mg/dL (ref 6–23)
CO2: 26 mEq/L (ref 19–32)
Calcium: 9.5 mg/dL (ref 8.4–10.5)
Chloride: 103 mEq/L (ref 96–112)
Creatinine, Ser: 0.8 mg/dL (ref 0.40–1.20)
GFR: 91.99 mL/min (ref >60.00)
Glucose, Bld: 92 mg/dL (ref 70–99)
Potassium: 4.1 mEq/L (ref 3.5–5.1)
Sodium: 137 mEq/L (ref 135–145)
Total Bilirubin: 0.5 mg/dL (ref 0.2–1.2)
Total Protein: 7 g/dL (ref 6.0–8.3)

## 2021-01-14 LAB — VITAMIN D 25 HYDROXY (VIT D DEFICIENCY, FRACTURES): VITD: 30.29 ng/mL (ref 30.00–100.00)

## 2021-01-15 ENCOUNTER — Telehealth: Payer: Self-pay | Admitting: Pharmacy Technician

## 2021-01-15 NOTE — Telephone Encounter (Signed)
F/U  Renew prior authorization Tysabri  Lilliauna Ledo (Key: [id number]) TYSABRI (natalizumab) 300MG /15ML concentrate   Form Letter of Consent Drug Appeal Form Appeal Created 3 days ago Sent to Plan 1 minute ago Determination Wait for Determination Please wait for the payer to return a determination.

## 2021-01-15 NOTE — Telephone Encounter (Signed)
Auth Submission: PENDING Payer: BCBS Medication & CPT/J Code(s) submitted: Tysabri (Natalizumab) [phone] Route of submission (phone, fax, portal): COVER MY MEDS PORTAL Auth type: Buy/Bill Units/visits requested: 12 Reference number: [id number]  Will update once we receive a response.

## 2021-01-19 NOTE — Telephone Encounter (Signed)
Dr. Tomi Likens.  Auth Follow-up: Tysabri DENIED - Please advise!  Payer: [place] of follow up: portal/phone/fax: COVER MY MEDS Reference number: [id number] Medication & CPT/J Code(s) : Tysabri (Natalizumab) [id number]  Authorization has been DENIED because the member has not tried and or failed Glatiramer, Avonex, Dimethyl Furmate, Gilenya. The member can not  take any MS disease modifying medications. The member has aggressive MS at the start of diagnosis.  Please advise!  Donna Higgins

## 2021-01-20 NOTE — Telephone Encounter (Signed)
Waiting on a response from Cyrstal at [place] is regards to her Quick start for Tysabri. Pt also approved and covered at 100 % or Assistance.

## 2021-01-20 NOTE — Telephone Encounter (Addendum)
Dr. Tomi Likens,  Auth Submission: [place] - (prior auth needed to be re-submitted, updated and corrected)  Payer: BCBS Medication & CPT/J Code(s) submitted: Tysabri (Natalizumab) [phone] Route of submission (phone, fax, portal): PHONE Auth type: Buy/Bill Units/visits requested: 12 Reference number: [id number] Approval from: 01/13/21 to 01/12/22  Spoke with Crystal at [employer]) patient has been approved. To prevent delay in treatment, medication (x1 dose) will be sent stat.  Patient has met deductible and OOP for the remaining of the year.   Patient will be schedule as soon as possible.

## 2021-01-21 ENCOUNTER — Telehealth: Payer: Self-pay

## 2021-01-21 DIAGNOSIS — G35 Multiple sclerosis: Secondary | ICD-10-CM

## 2021-01-21 LAB — STRATIFY JCV AB (W/ INDEX) W/ RFLX
Index Value: 0.18
Stratify JCV (TM) Ab w/Reflex Inhibition: NEGATIVE

## 2021-01-21 NOTE — Telephone Encounter (Signed)
Pt called in and left a message. She was returning [facility] call.

## 2021-01-21 NOTE — Telephone Encounter (Signed)
-----   Message from Pieter Partridge, DO sent at 01/21/2021  7:00 AM EDT ----- JC Virus antibody is negative.  We can go ahead and get started on Tysabri as soon as possible.  Repeat JC Virus antibody and index, CBC with diff and vit D in 6 months.

## 2021-01-21 NOTE — Telephone Encounter (Signed)
Pt returned our call. Pt advised of Lab results, [facility] should be given pt a call to schedule date of infusion. Pt approved for quick start.

## 2021-01-21 NOTE — Telephone Encounter (Signed)
Tried calling pt no answer, LMOVM to call the office back.

## 2021-01-21 NOTE — Telephone Encounter (Signed)
F/u   Received fax from [facility] was approve for the period from 01/21/2021 to  01/21/2022

## 2021-01-23 ENCOUNTER — Other Ambulatory Visit: Payer: Self-pay

## 2021-01-23 ENCOUNTER — Encounter: Payer: Self-pay | Admitting: Neurology

## 2021-01-23 ENCOUNTER — Ambulatory Visit
Admission: RE | Admit: 2021-01-23 | Discharge: 2021-01-23 | Disposition: A | Discharge status: Home / Self Care | Payer: BC Managed Care – PPO | Source: Ambulatory Visit | Attending: Neurology | Admitting: Neurology

## 2021-01-23 IMAGING — MR MR HEAD WO/W CM
14 series · 46 of 48 positions shown · IV contrast (multihance)
Comparison: Brain MRI [DATE].

CLINICAL DATA: 40-year-old female diagnosed with multiple sclerosis
this year. History also left upper lobe pulmonary carcinoid.
Baseline study for treatment.

EXAM:
MRI HEAD WITHOUT AND WITH CONTRAST
TECHNIQUE: Multiplanar, multiecho pulse sequences of the brain and surrounding
structures were obtained without and with intravenous contrast.
CONTRAST:  15mL MULTIHANCE GADOBENATE DIMEGLUMINE 529 MG/ML IV SOLN

[Series 2: T1 · sagittal · 5.0mm · 0.45mm/px · 2 of 23 slices shown]
[im 1/23]
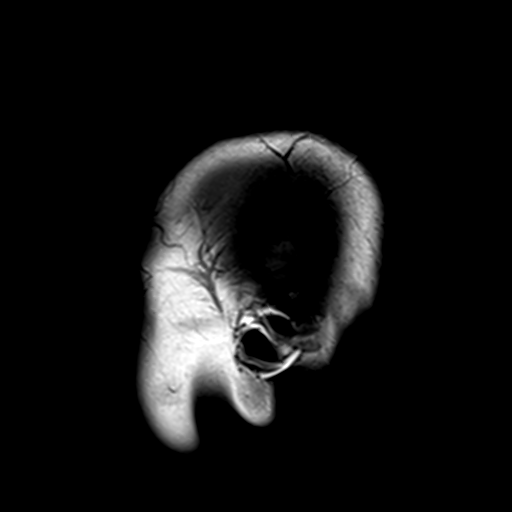
[im 23/23]
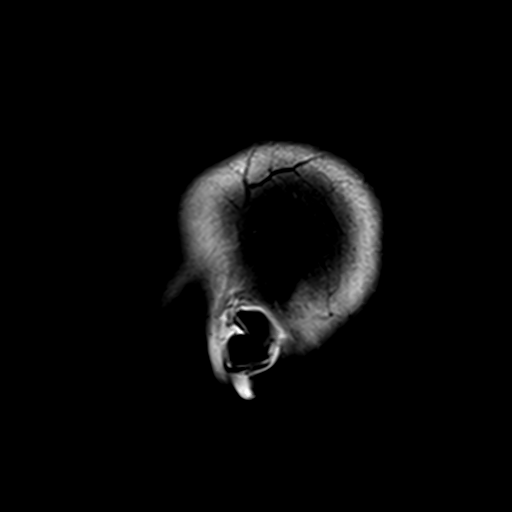

[Series 3: FLAIR · sagittal · 5.0mm · 0.45mm/px · 2 of 25 slices shown (1 of 2)]
[im 1/25]
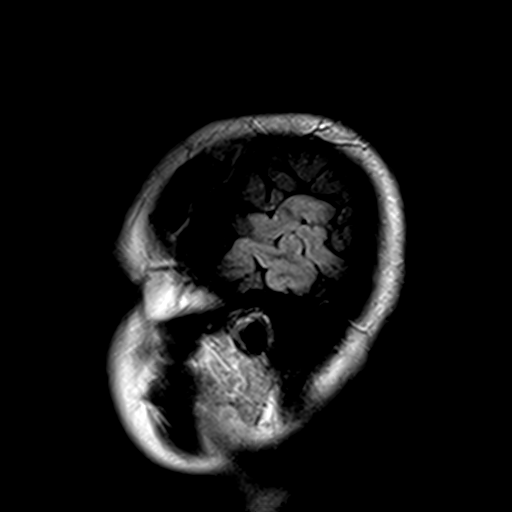
[im 25/25]
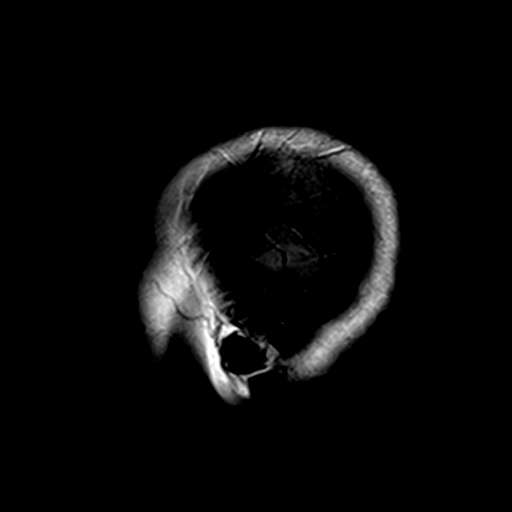

[Series 4: DWI · axial · 3.0mm · 1.80mm/px · z∈[-50,+96]mm · 6 of 100 slices shown (1 of 4)]
[im 1/100]
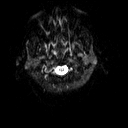
[im 20/100]
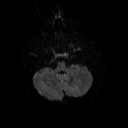
[im 40/100]
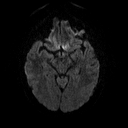
[im 60/100]
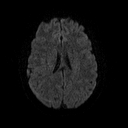
[im 80/100]
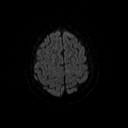
[im 100/100]
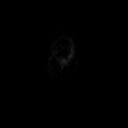

[Series 5: DWI · axial · 3.0mm · 1.80mm/px · z∈[-50,+96]mm · 3 of 46 slices shown (2 of 4)]
[im 1/46]
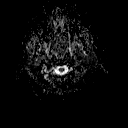
[im 23/46]
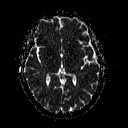
[im 46/46]
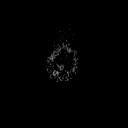

[Series 6: DWI · coronal · 5.0mm · 1.80mm/px · 4 of 68 slices shown (3 of 4)]
[im 1/68]
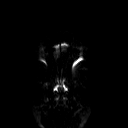
[im 23/68]
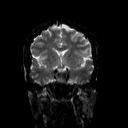
[im 45/68]
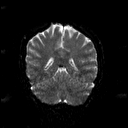
[im 68/68]
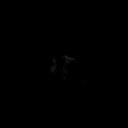

[Series 7: DWI · coronal · 5.0mm · 1.80mm/px · 2 of 34 slices shown (4 of 4)]
[im 1/34]
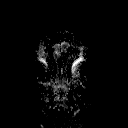
[im 34/34]
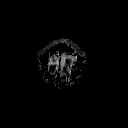

[Series 8: T2 · axial · 5.0mm · 0.60mm/px · z∈[-53,+101]mm · 2 of 24 slices shown (1 of 2)]
[im 1/24]
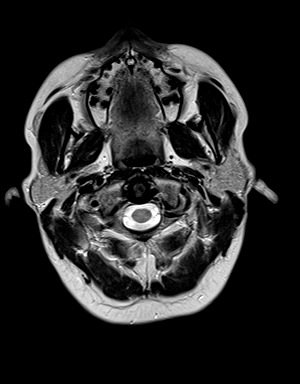
[im 24/24]
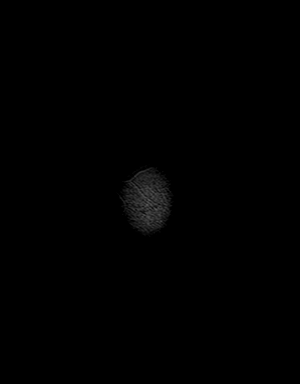

[Series 9: FLAIR · axial · 3.0mm · 0.45mm/px · z∈[-51,+97]mm · 2 of 33 slices shown (2 of 2)]
[im 1/33]
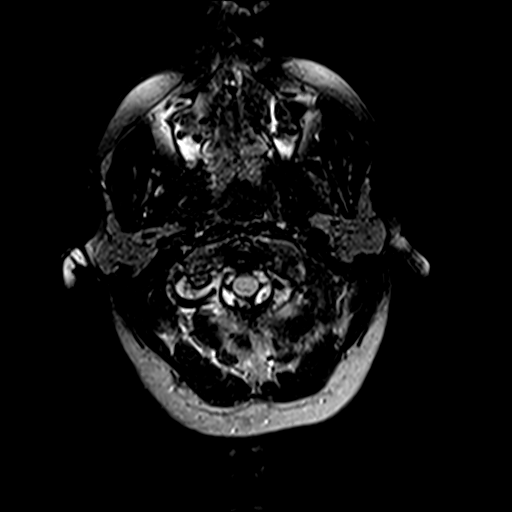
[im 33/33]
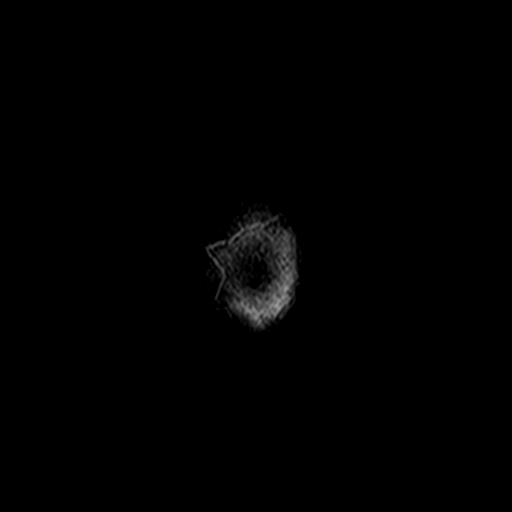

[Series 11: swi_images · axial · 4.0mm · 0.90mm/px · z∈[-46,+93]mm · 2 of 36 slices shown]
[im 1/36]
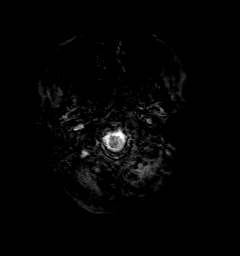
[im 36/36]
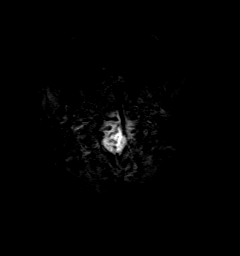

[Series 12: t1_mpr_tra · axial · 1.0mm · 0.75mm/px · z∈[-49,+94]mm · 8 of 144 slices shown]
[im 1/144]
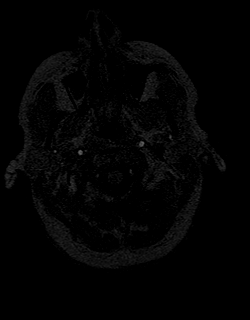
[im 18/144]
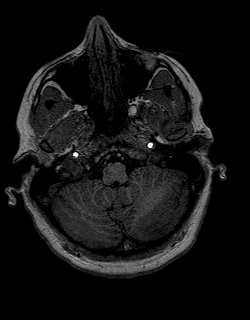
[im 36/144]
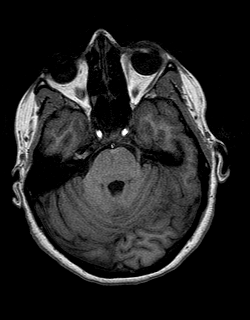
[im 54/144]
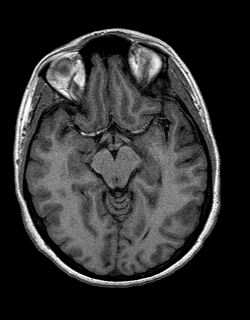
[im 90/144]
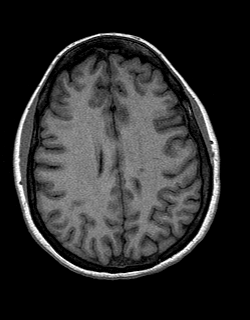
[im 108/144]
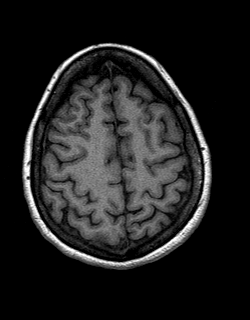
[im 126/144]
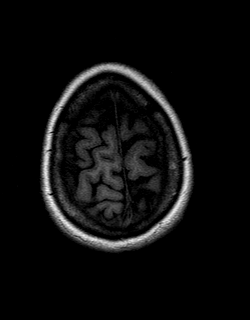
[im 144/144]
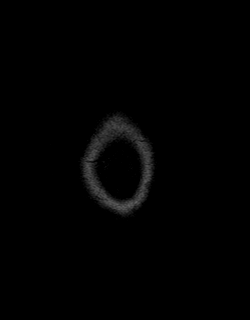

[Series 13: T2 · coronal · 5.0mm · 0.45mm/px · 2 of 28 slices shown (2 of 2)]
[im 1/28]
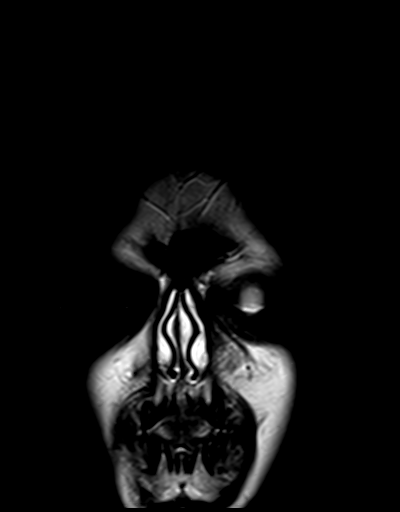
[im 28/28]
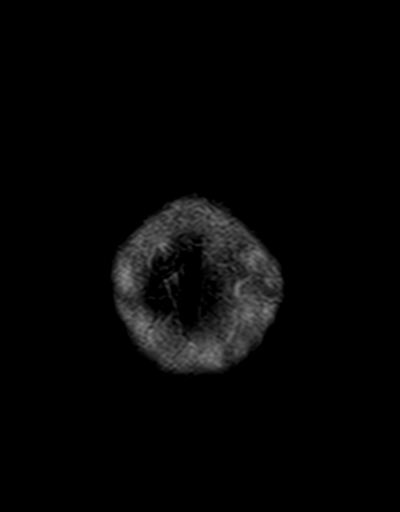

[Series 14: t1_mpr_tra post · axial · 1.0mm · 0.75mm/px · z∈[+120,+262]mm · 8 of 144 slices shown]
[im 1/144]
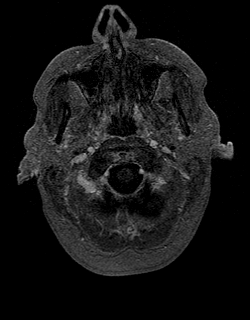
[im 18/144]
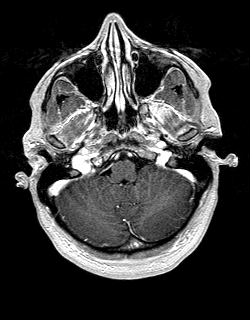
[im 36/144]
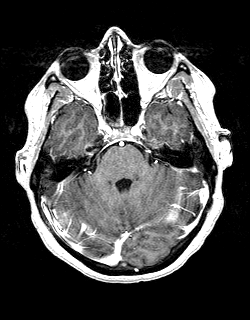
[im 54/144]
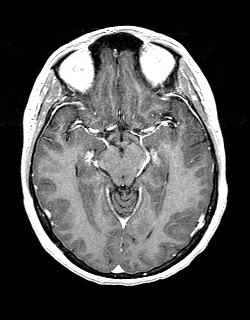
[im 90/144]
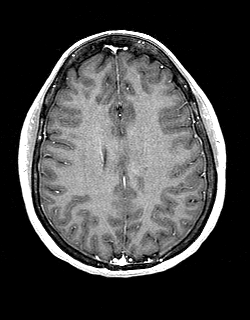
[im 108/144]
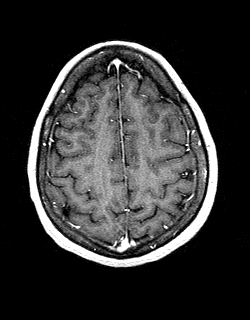
[im 126/144]
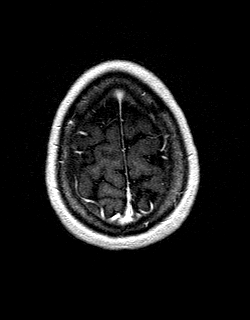
[im 144/144]
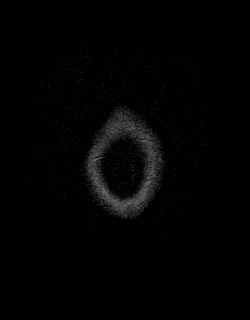

[Series 15: post cor · coronal · 5.0mm · 0.45mm/px · 2 of 27 slices shown]
[im 1/27]
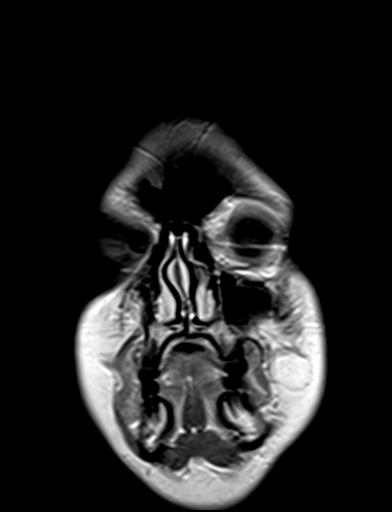
[im 27/27]
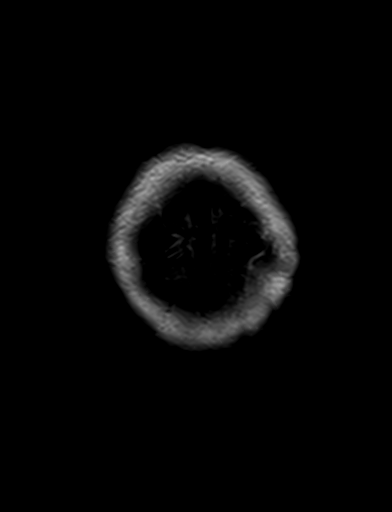

[Series 16: T1 post-contrast · sagittal · 5.0mm · 0.45mm/px · 1 of 23 slices shown]
[im 1/23]
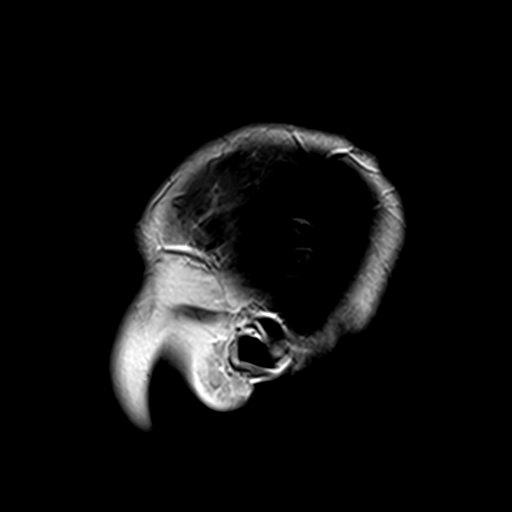

[46 of 48 positions shown; findings below may reference images not displayed]

FINDINGS: Brain: About half a dozen nodular periventricular T2 and FLAIR
hyperintense white matter lesions scattered in both hemispheres
remain stable since [REDACTED]. And there are few small superimposed
subcortical white matter lesions in both hemispheres, including
temporal lobes (series [QS] left). No cortical involvement or
encephalomalacia identified. Gray matter nuclei in the brainstem
remain spared. Subtle bilateral cerebellar involvement (series 8,
image 5 left) is stable.

Normal cerebral volume. No superimposed restricted diffusion. No
midline shift, mass effect, evidence of mass lesion,
ventriculomegaly, extra-axial collection or acute intracranial
hemorrhage. Cervicomedullary junction and pituitary are within
normal limits. No chronic cerebral blood products.

No abnormal enhancement identified. No dural thickening.

Vascular: Major intracranial vascular flow voids are preserved. The
major dural venous sinuses are enhancing and appear to be patent.

Skull and upper cervical spine: Cervical spine is detailed
separately. Visualized bone marrow signal is within normal limits.

Sinuses/Orbits: There is asymmetric T2 and FLAIR hyperintensity
suspected in the right optic nerve. But otherwise negative orbits.
Regressed right sphenoid sinus mucosal thickening and bubbly opacity
since [REDACTED]. Other paranasal Visualized paranasal sinuses and
mastoids are stable and well aerated.

Other: Visible internal auditory structures appear normal. Negative
visible scalp face.
IMPRESSION: 1. Stable.  No metastatic disease or acute intracranial abnormality.
Relatively mild scattered white matter signal abnormality in both
cerebral hemispheres, and subtle bilateral cerebellar involvement,
compatible with chronic demyelinating disease. No active or
progressive demyelination identified.

2. Cervical and thoracic spine MRI today reported separately.

## 2021-01-23 MED ORDER — GADOBENATE DIMEGLUMINE 529 MG/ML IV SOLN
15.0000 mL | Freq: Once | INTRAVENOUS | Status: AC | PRN
  Administered 2021-01-23: 15 mL via INTRAVENOUS

## 2021-01-25 ENCOUNTER — Ambulatory Visit
Admission: RE | Admit: 2021-01-25 | Discharge: 2021-01-25 | Disposition: A | Discharge status: Home / Self Care | Payer: BC Managed Care – PPO | Source: Ambulatory Visit | Attending: Neurology | Admitting: Neurology

## 2021-01-25 ENCOUNTER — Other Ambulatory Visit: Payer: Self-pay

## 2021-01-25 IMAGING — MR MR THORACIC SPINE WO/W CM
4 of 8 series · 18 of 48 positions shown · IV contrast (multihance)
Comparison: Brain and cervical spine MRI reported separately.
Outside thoracic spine MRI [DATE]. PET-CT [DATE].

CLINICAL DATA: 40-year-old female diagnosed with multiple sclerosis
this year. History also left upper lobe pulmonary carcinoid, and
left pneumonectomy. Baseline study for treatment.

EXAM:
MRI THORACIC WITHOUT AND WITH CONTRAST
TECHNIQUE: Multiplanar and multiecho pulse sequences of the thoracic spine were
obtained without and with intravenous contrast.
CONTRAST:  16mL MULTIHANCE GADOBENATE DIMEGLUMINE 529 MG/ML IV SOLN
in conjunction with contrast enhanced imaging of the brain and
cervical spine reported separately.

[Series 5: T2 post-contrast · sagittal · 3.0mm · 0.55mm/px · 4 of 19 slices shown]
[im 1/19]
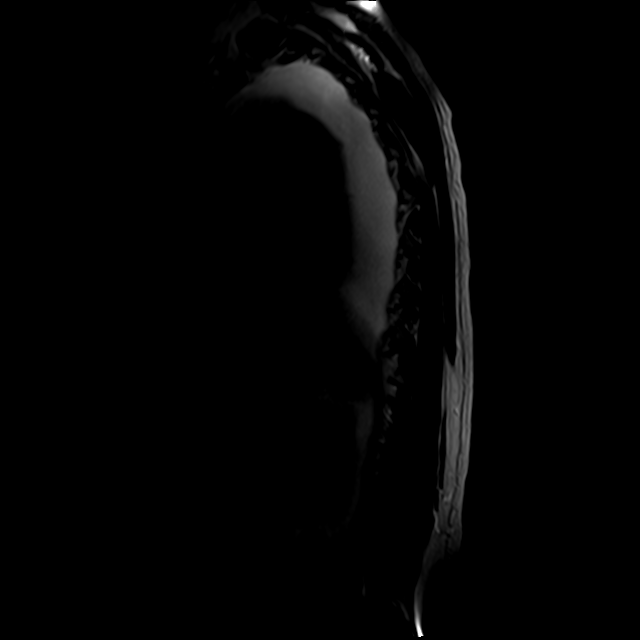
[im 7/19]
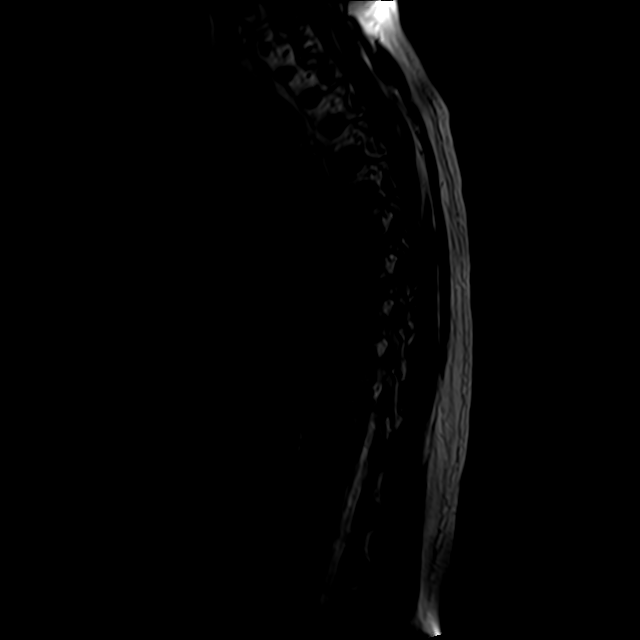
[im 13/19]
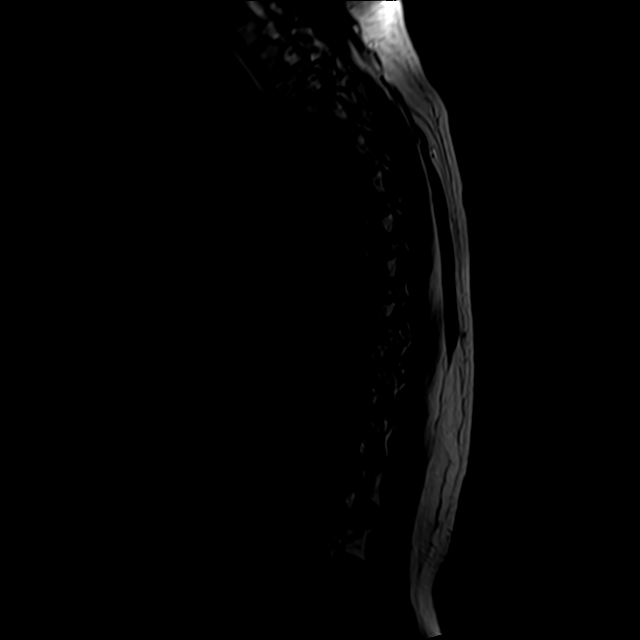
[im 19/19]
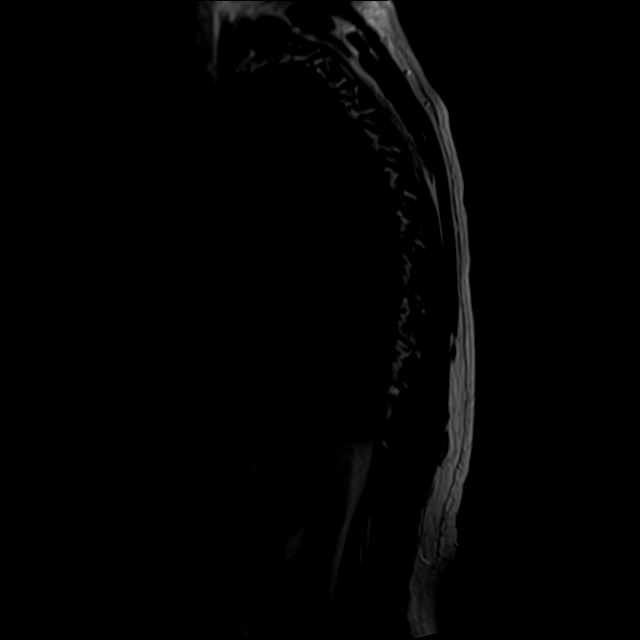

[Series 6: T1 · sagittal · 3.0mm · 0.55mm/px · 3 of 19 slices shown (1 of 2)]
[im 1/19]
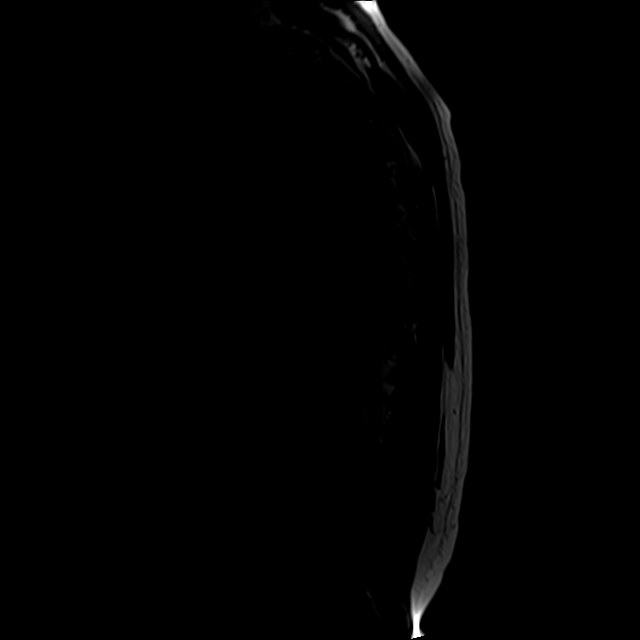
[im 13/19]
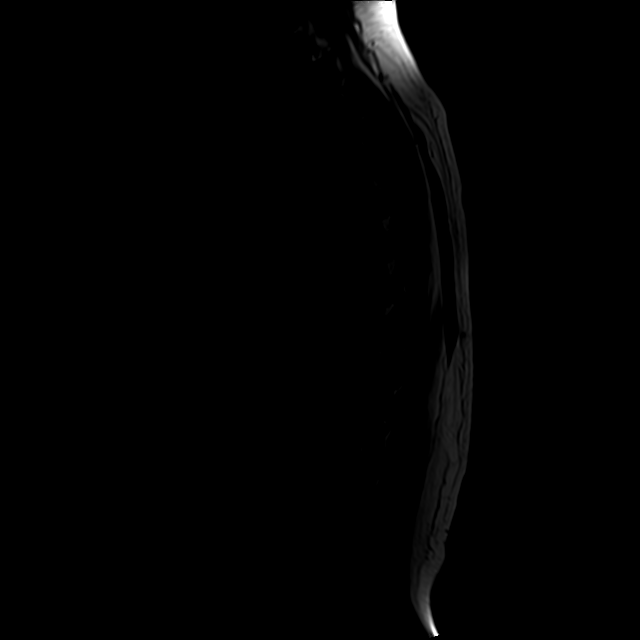
[im 19/19]
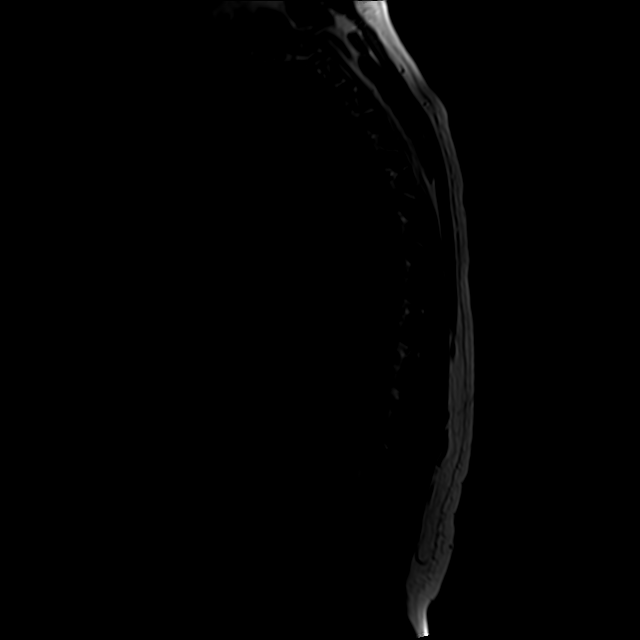

[Series 7: T2 · axial · 4.0mm · 0.39mm/px · z∈[-240,-58]mm · 8 of 36 slices shown]
[im 1/36]
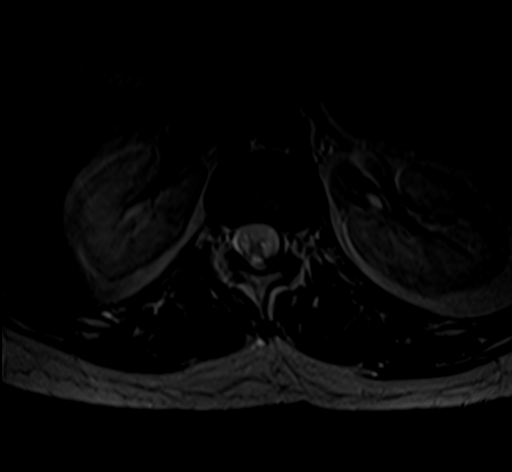
[im 6/36]
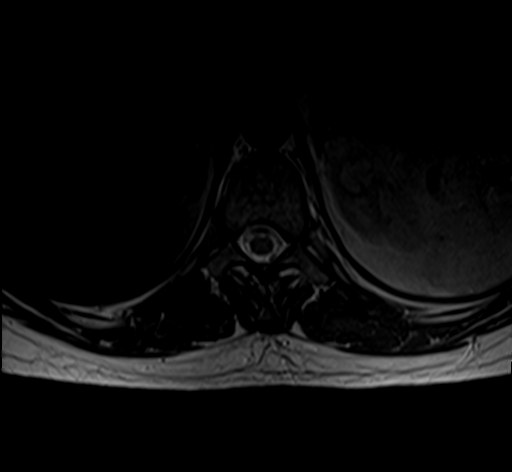
[im 11/36]
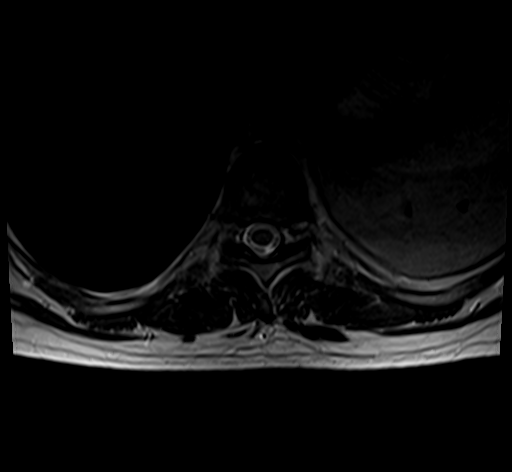
[im 16/36]
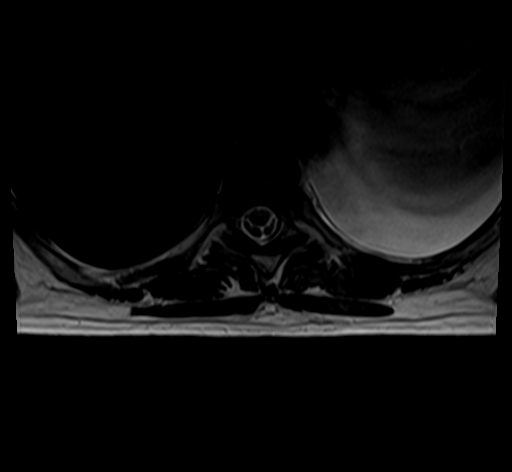
[im 21/36]
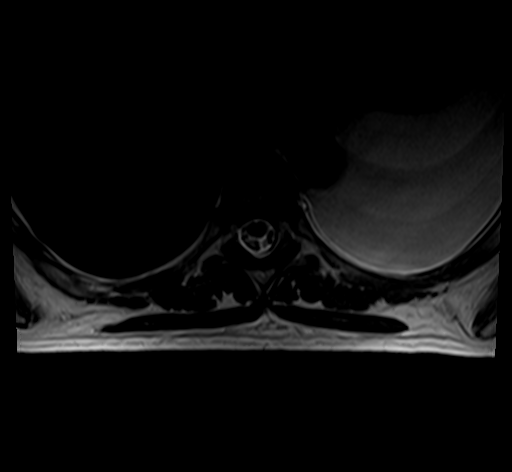
[im 26/36]
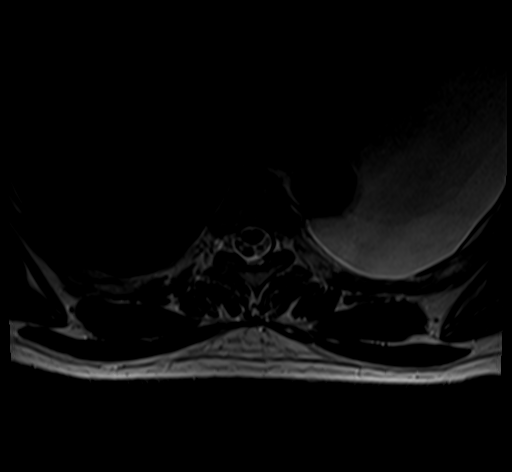
[im 31/36]
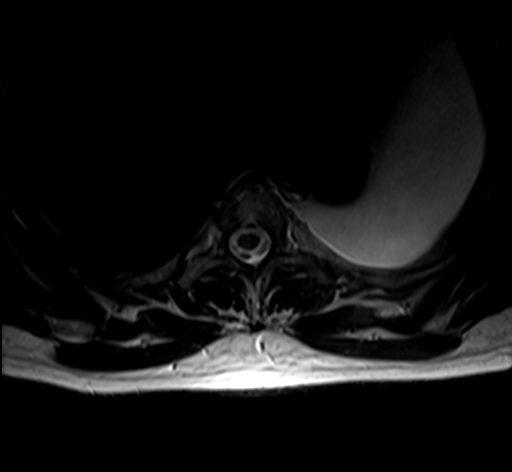
[im 36/36]
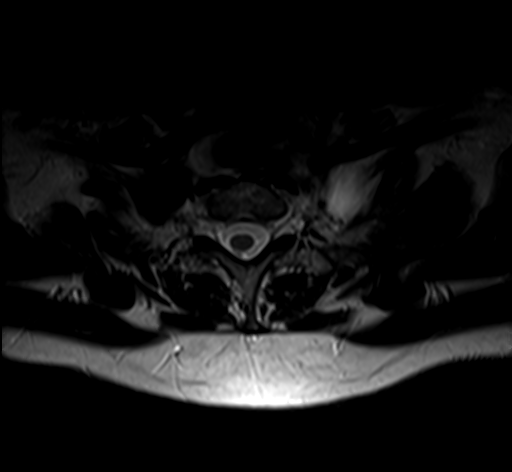

[Series 9: T1 · axial · 4.0mm · 0.78mm/px · z∈[-205,-87]mm · 3 of 36 slices shown (2 of 2)]
[im 6/36]
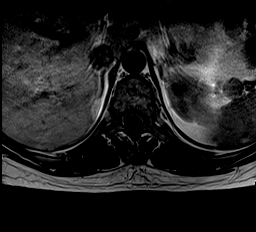
[im 21/36]
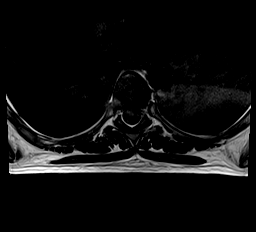
[im 31/36]
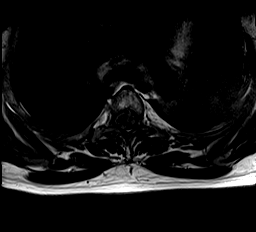

[18 of 48 positions shown; findings below may reference images not displayed]

FINDINGS: Limited cervical spine imaging:  Detailed separately.

Thoracic spine segmentation:  Appears to be normal.

Alignment: Thoracic kyphosis is stable since KISUNDEV. No
spondylolisthesis.

Vertebrae: Visualized bone marrow signal is within normal limits.
There is a small benign vertebral body hemangioma superiorly and T3
with intrinsic T1 hyperintensity (series 6, image 8). No marrow
edema or evidence of acute osseous abnormality.

Cord: A subtle STIR hyperintense ventral cord lesion at T10-T11 has
regressed since KISUNDEV. There remains mild T2 heterogeneity there on
series 7, image 30. No definite cord volume loss. No abnormal
enhancement identified.

No other discrete thoracic cord lesion. No dural thickening.

Paraspinal and other soft tissues: Fluid-filled left hemithorax now
compatible with sequelae of pneumonectomy. Grossly negative visible
right lung. No pericardial effusion is evident. Negative visible
upper abdominal viscera.

Thoracic paraspinal soft tissues appear negative.

Disc levels:

T1-T2: Negative.

T2-T3: Negative.

T3-T4: Negative.

T4-T5: Negative.

T5-T6: Borderline to mild facet hypertrophy. Otherwise negative. No
stenosis.

T6-T7: Negative.

T7-T8: Subtle disc desiccation. Borderline to mild facet
hypertrophy. No stenosis.

T8-T9: Negative.

T9-T10: Negative.

T10-T11: Negative.

T11-T12: Negative.

T12-L1: Negative.

Grossly negative visible upper lumbar levels.
IMPRESSION: 1. Regression of a solitary thoracic cord lesion at T10-T[DATE]. Mild residual ventral cord T2 hyperintensity there with no
enhancement.
2. No additional thoracic spinal cord demyelination identified. No
metastatic disease identified.
3. Minimal thoracic spine degeneration.
4. Fluid-filled left hemithorax now compatible with sequelae of left
pneumonectomy.

## 2021-01-25 MED ORDER — GADOBENATE DIMEGLUMINE 529 MG/ML IV SOLN
16.0000 mL | Freq: Once | INTRAVENOUS | Status: AC | PRN
  Administered 2021-01-25: 16 mL via INTRAVENOUS

## 2021-01-26 NOTE — Telephone Encounter (Signed)
Auth Submission:APPROVED   [employer] -TYSABRI CO-PAY CARD DATE: 01/21/21 $20,000 per calender year Exp: 10/10/22

## 2021-01-28 ENCOUNTER — Ambulatory Visit (INDEPENDENT_AMBULATORY_CARE_PROVIDER_SITE_OTHER): Payer: BC Managed Care – PPO

## 2021-01-28 ENCOUNTER — Other Ambulatory Visit: Payer: Self-pay

## 2021-01-28 VITALS — BP 95/67 | HR 91 | Temp 98.4°F | Resp 18 | Ht 64.0 in | Wt 170.2 lb

## 2021-01-28 DIAGNOSIS — G35 Multiple sclerosis: Secondary | ICD-10-CM | POA: Diagnosis not present

## 2021-01-28 MED ORDER — SODIUM CHLORIDE 0.9% FLUSH: 10.0000 mL | Freq: Once | INTRAVENOUS | Status: DC | PRN

## 2021-01-28 MED ORDER — METHYLPREDNISOLONE SODIUM SUCC 125 MG IJ SOLR: 125.0000 mg | Freq: Once | INTRAMUSCULAR | Status: DC | PRN

## 2021-01-28 MED ORDER — HEPARIN SOD (PORK) LOCK FLUSH 100 UNIT/ML IV SOLN: 250.0000 [IU] | Freq: Once | INTRAVENOUS | Status: DC | PRN

## 2021-01-28 MED ORDER — SODIUM CHLORIDE 0.9 % IV SOLN: INTRAVENOUS | Status: DC

## 2021-01-28 MED ORDER — FAMOTIDINE IN NACL 20-0.9 MG/50ML-% IV SOLN: 20.0000 mg | Freq: Once | INTRAVENOUS | Status: DC | PRN

## 2021-01-28 MED ORDER — DIPHENHYDRAMINE HCL 50 MG/ML IJ SOLN: 50.0000 mg | Freq: Once | INTRAMUSCULAR | Status: DC | PRN

## 2021-01-28 MED ORDER — EPINEPHRINE 0.3 MG/0.3ML IJ SOAJ: 0.3000 mg | Freq: Once | INTRAMUSCULAR | Status: DC | PRN

## 2021-01-28 MED ORDER — LORATADINE 10 MG PO TABS
10.0000 mg | ORAL_TABLET | Freq: Once | ORAL | Status: AC
  Administered 2021-01-28: 10 mg via ORAL
  Filled 2021-01-28: qty 1

## 2021-01-28 MED ORDER — ACETAMINOPHEN 325 MG PO TABS
650.0000 mg | ORAL_TABLET | Freq: Once | ORAL | Status: AC
  Administered 2021-01-28: 650 mg via ORAL
  Filled 2021-01-28: qty 2

## 2021-01-28 MED ORDER — ANTICOAGULANT SODIUM CITRATE 4% (200MG/5ML) IV SOLN
5.0000 mL | Freq: Once | Status: DC | PRN
  Filled 2021-01-28: qty 5

## 2021-01-28 MED ORDER — ALTEPLASE 2 MG IJ SOLR: 2.0000 mg | Freq: Once | INTRAMUSCULAR | Status: DC | PRN

## 2021-01-28 MED ORDER — SODIUM CHLORIDE 0.9 % IV SOLN: Freq: Once | INTRAVENOUS | Status: DC | PRN

## 2021-01-28 MED ORDER — ALBUTEROL SULFATE HFA 108 (90 BASE) MCG/ACT IN AERS: 2.0000 | INHALATION_SPRAY | Freq: Once | RESPIRATORY_TRACT | Status: DC | PRN

## 2021-01-28 MED ORDER — SODIUM CHLORIDE 0.9% FLUSH: 3.0000 mL | Freq: Once | INTRAVENOUS | Status: DC | PRN

## 2021-01-28 MED ORDER — SODIUM CHLORIDE 0.9 % IV SOLN
300.0000 mg | Freq: Once | INTRAVENOUS | Status: AC
  Administered 2021-01-28: 300 mg via INTRAVENOUS
  Filled 2021-01-28: qty 15

## 2021-01-28 MED ORDER — HEPARIN SOD (PORK) LOCK FLUSH 100 UNIT/ML IV SOLN: 500.0000 [IU] | Freq: Once | INTRAVENOUS | Status: DC | PRN

## 2021-01-28 NOTE — Progress Notes (Signed)
Diagnosis: Multiple Sclerosis  Provider:  Marshell Garfinkel, MD  Procedure: Infusion  IV Type: Peripheral, IV Location: L Antecubital  Tysabri (Natalizumab), Dose: 300 mg  Infusion Start Time: 7897  Infusion Stop Time: 1119  Post Infusion IV Care: Observation period completed and Peripheral IV Discontinued  Discharge: Condition: Good, Destination: Home . AVS provided to patient.   Performed by:  Charlie Pitter, RN

## 2021-02-23 ENCOUNTER — Ambulatory Visit: Payer: BC Managed Care – PPO | Admitting: Neurology

## 2021-02-25 ENCOUNTER — Ambulatory Visit (INDEPENDENT_AMBULATORY_CARE_PROVIDER_SITE_OTHER): Payer: BC Managed Care – PPO

## 2021-02-25 ENCOUNTER — Other Ambulatory Visit: Payer: Self-pay

## 2021-02-25 VITALS — BP 107/74 | HR 102 | Temp 97.7°F | Resp 18 | Ht 64.0 in | Wt 175.4 lb

## 2021-02-25 DIAGNOSIS — G35 Multiple sclerosis: Secondary | ICD-10-CM

## 2021-02-25 MED ORDER — SODIUM CHLORIDE 0.9 % IV SOLN: Freq: Once | INTRAVENOUS | Status: DC | PRN

## 2021-02-25 MED ORDER — FAMOTIDINE IN NACL 20-0.9 MG/50ML-% IV SOLN: 20.0000 mg | Freq: Once | INTRAVENOUS | Status: DC | PRN

## 2021-02-25 MED ORDER — METHYLPREDNISOLONE SODIUM SUCC 125 MG IJ SOLR: 125.0000 mg | Freq: Once | INTRAMUSCULAR | Status: DC | PRN

## 2021-02-25 MED ORDER — SODIUM CHLORIDE 0.9 % IV SOLN: INTRAVENOUS | Status: DC

## 2021-02-25 MED ORDER — ALBUTEROL SULFATE HFA 108 (90 BASE) MCG/ACT IN AERS: 2.0000 | INHALATION_SPRAY | Freq: Once | RESPIRATORY_TRACT | Status: DC | PRN

## 2021-02-25 MED ORDER — LORATADINE 10 MG PO TABS
10.0000 mg | ORAL_TABLET | Freq: Once | ORAL | Status: AC
  Administered 2021-02-25: 10 mg via ORAL
  Filled 2021-02-25: qty 1

## 2021-02-25 MED ORDER — DIPHENHYDRAMINE HCL 50 MG/ML IJ SOLN: 50.0000 mg | Freq: Once | INTRAMUSCULAR | Status: DC | PRN

## 2021-02-25 MED ORDER — SODIUM CHLORIDE 0.9 % IV SOLN
300.0000 mg | Freq: Once | INTRAVENOUS | Status: AC
  Administered 2021-02-25: 300 mg via INTRAVENOUS
  Filled 2021-02-25: qty 15

## 2021-02-25 MED ORDER — ACETAMINOPHEN 325 MG PO TABS
650.0000 mg | ORAL_TABLET | Freq: Once | ORAL | Status: AC
  Administered 2021-02-25: 650 mg via ORAL
  Filled 2021-02-25: qty 2

## 2021-02-25 MED ORDER — EPINEPHRINE 0.3 MG/0.3ML IJ SOAJ: 0.3000 mg | Freq: Once | INTRAMUSCULAR | Status: DC | PRN

## 2021-02-25 NOTE — Progress Notes (Signed)
Diagnosis: Multiple Sclerosis  Provider:  Marshell Garfinkel, MD  Procedure: Infusion  IV Type: Peripheral, IV Location: R Antecubital  Tysabri (Natalizumab), Dose: 300 mg  Infusion Start Time: 4451  Infusion Stop Time: 4604 .......   Post Infusion IV Care: Observation period completed and Peripheral IV Discontinued  Discharge: Condition: Good, Destination: Home . AVS provided to patient.   Performed by:  Charlie Pitter, RN

## 2021-03-25 ENCOUNTER — Other Ambulatory Visit: Payer: Self-pay

## 2021-03-25 ENCOUNTER — Ambulatory Visit (INDEPENDENT_AMBULATORY_CARE_PROVIDER_SITE_OTHER): Payer: BC Managed Care – PPO

## 2021-03-25 VITALS — BP 112/62 | HR 92 | Temp 98.2°F | Resp 18 | Ht 64.0 in | Wt 179.4 lb

## 2021-03-25 DIAGNOSIS — G35 Multiple sclerosis: Secondary | ICD-10-CM | POA: Diagnosis not present

## 2021-03-25 MED ORDER — DIPHENHYDRAMINE HCL 50 MG/ML IJ SOLN: 50.0000 mg | Freq: Once | INTRAMUSCULAR | Status: DC | PRN

## 2021-03-25 MED ORDER — SODIUM CHLORIDE 0.9 % IV SOLN: INTRAVENOUS | Status: DC

## 2021-03-25 MED ORDER — EPINEPHRINE 0.3 MG/0.3ML IJ SOAJ: 0.3000 mg | Freq: Once | INTRAMUSCULAR | Status: DC | PRN

## 2021-03-25 MED ORDER — LORATADINE 10 MG PO TABS
10.0000 mg | ORAL_TABLET | Freq: Once | ORAL | Status: AC
  Administered 2021-03-25: 13:00:00 10 mg via ORAL

## 2021-03-25 MED ORDER — SODIUM CHLORIDE 0.9 % IV SOLN: Freq: Once | INTRAVENOUS | Status: DC | PRN

## 2021-03-25 MED ORDER — SODIUM CHLORIDE 0.9 % IV SOLN
300.0000 mg | Freq: Once | INTRAVENOUS | Status: AC
  Administered 2021-03-25: 14:00:00 300 mg via INTRAVENOUS
  Filled 2021-03-25: qty 15

## 2021-03-25 MED ORDER — METHYLPREDNISOLONE SODIUM SUCC 125 MG IJ SOLR: 125.0000 mg | Freq: Once | INTRAMUSCULAR | Status: DC | PRN

## 2021-03-25 MED ORDER — FAMOTIDINE IN NACL 20-0.9 MG/50ML-% IV SOLN: 20.0000 mg | Freq: Once | INTRAVENOUS | Status: DC | PRN

## 2021-03-25 MED ORDER — ALBUTEROL SULFATE HFA 108 (90 BASE) MCG/ACT IN AERS: 2.0000 | INHALATION_SPRAY | Freq: Once | RESPIRATORY_TRACT | Status: DC | PRN

## 2021-03-25 MED ORDER — ACETAMINOPHEN 325 MG PO TABS
650.0000 mg | ORAL_TABLET | Freq: Once | ORAL | Status: AC
  Administered 2021-03-25: 13:00:00 650 mg via ORAL

## 2021-03-25 NOTE — Progress Notes (Signed)
Diagnosis: Multiple Sclerosis  Provider:  Marshell Garfinkel, MD  Procedure: Infusion  IV Type: Peripheral, IV Location: R Antecubital  Tysabri (Natalizumab), Dose: 300 mg  Infusion Start Time: 9326  Infusion Stop Time: 1508  Post Infusion IV Care: Peripheral IV Discontinued  Discharge: Condition: Good, Destination: Home . AVS provided to patient.   Performed by:  Koren Shiver, RN

## 2021-04-22 ENCOUNTER — Other Ambulatory Visit: Payer: Self-pay

## 2021-04-22 ENCOUNTER — Ambulatory Visit (INDEPENDENT_AMBULATORY_CARE_PROVIDER_SITE_OTHER): Payer: BC Managed Care – PPO

## 2021-04-22 VITALS — BP 110/79 | HR 90 | Temp 97.3°F | Resp 16 | Ht 64.0 in | Wt 176.0 lb

## 2021-04-22 DIAGNOSIS — G35 Multiple sclerosis: Secondary | ICD-10-CM

## 2021-04-22 MED ORDER — EPINEPHRINE 0.3 MG/0.3ML IJ SOAJ: 0.3000 mg | Freq: Once | INTRAMUSCULAR | Status: DC | PRN

## 2021-04-22 MED ORDER — METHYLPREDNISOLONE SODIUM SUCC 125 MG IJ SOLR: 125.0000 mg | Freq: Once | INTRAMUSCULAR | Status: DC | PRN

## 2021-04-22 MED ORDER — SODIUM CHLORIDE 0.9 % IV SOLN: INTRAVENOUS | Status: DC

## 2021-04-22 MED ORDER — ACETAMINOPHEN 325 MG PO TABS
650.0000 mg | ORAL_TABLET | Freq: Once | ORAL | Status: AC
  Administered 2021-04-22: 650 mg via ORAL
  Filled 2021-04-22: qty 2

## 2021-04-22 MED ORDER — SODIUM CHLORIDE 0.9 % IV SOLN
300.0000 mg | Freq: Once | INTRAVENOUS | Status: AC
  Administered 2021-04-22: 300 mg via INTRAVENOUS
  Filled 2021-04-22: qty 15

## 2021-04-22 MED ORDER — DIPHENHYDRAMINE HCL 50 MG/ML IJ SOLN: 50.0000 mg | Freq: Once | INTRAMUSCULAR | Status: DC | PRN

## 2021-04-22 MED ORDER — ALBUTEROL SULFATE HFA 108 (90 BASE) MCG/ACT IN AERS: 2.0000 | INHALATION_SPRAY | Freq: Once | RESPIRATORY_TRACT | Status: DC | PRN

## 2021-04-22 MED ORDER — SODIUM CHLORIDE 0.9 % IV SOLN: Freq: Once | INTRAVENOUS | Status: DC | PRN

## 2021-04-22 MED ORDER — FAMOTIDINE IN NACL 20-0.9 MG/50ML-% IV SOLN: 20.0000 mg | Freq: Once | INTRAVENOUS | Status: DC | PRN

## 2021-04-22 MED ORDER — LORATADINE 10 MG PO TABS
10.0000 mg | ORAL_TABLET | Freq: Once | ORAL | Status: AC
  Administered 2021-04-22: 10 mg via ORAL
  Filled 2021-04-22: qty 1

## 2021-04-22 NOTE — Patient Instructions (Signed)
\  [id number]\ °

## 2021-04-22 NOTE — Progress Notes (Signed)
Diagnosis: Multiple Sclerosis  Provider:  Marshell Garfinkel, MD  Procedure: Infusion  IV Type: Peripheral, IV Location: L Antecubital  Tysabri (Natalizumab), Dose: 300 mg  Infusion Start Time: 0174  Infusion Stop Time: 9449  Post Infusion IV Care: Observation period completed and Peripheral IV Discontinued  Discharge: Condition: Good, Destination: Home . AVS provided to patient.   Performed by:  Charlie Pitter, RN

## 2021-05-10 ENCOUNTER — Other Ambulatory Visit: Payer: Self-pay | Admitting: Pharmacy Technician

## 2021-05-20 ENCOUNTER — Ambulatory Visit: Payer: BC Managed Care – PPO | Admitting: *Deleted

## 2021-05-20 ENCOUNTER — Other Ambulatory Visit: Payer: Self-pay

## 2021-05-20 VITALS — BP 101/68 | HR 99 | Temp 98.6°F | Resp 17 | Ht 64.0 in | Wt 174.0 lb

## 2021-05-20 DIAGNOSIS — G35 Multiple sclerosis: Secondary | ICD-10-CM

## 2021-05-20 MED ORDER — SODIUM CHLORIDE 0.9 % IV SOLN: Freq: Once | INTRAVENOUS | Status: AC | PRN

## 2021-05-20 MED ORDER — ALBUTEROL SULFATE HFA 108 (90 BASE) MCG/ACT IN AERS: 2.0000 | INHALATION_SPRAY | Freq: Once | RESPIRATORY_TRACT | Status: AC | PRN

## 2021-05-20 MED ORDER — SODIUM CHLORIDE 0.9 % IV SOLN: INTRAVENOUS | Status: AC

## 2021-05-20 MED ORDER — SODIUM CHLORIDE 0.9 % IV SOLN
300.0000 mg | Freq: Once | INTRAVENOUS | Status: AC
  Administered 2021-05-20: 300 mg via INTRAVENOUS
  Filled 2021-05-20: qty 15

## 2021-05-20 MED ORDER — LORATADINE 10 MG PO TABS
10.0000 mg | ORAL_TABLET | Freq: Once | ORAL | Status: AC
  Administered 2021-05-20: 10 mg via ORAL
  Filled 2021-05-20: qty 1

## 2021-05-20 MED ORDER — EPINEPHRINE 0.3 MG/0.3ML IJ SOAJ: 0.3000 mg | Freq: Once | INTRAMUSCULAR | Status: AC | PRN

## 2021-05-20 MED ORDER — FAMOTIDINE IN NACL 20-0.9 MG/50ML-% IV SOLN: 20.0000 mg | Freq: Once | INTRAVENOUS | Status: AC | PRN

## 2021-05-20 MED ORDER — ACETAMINOPHEN 325 MG PO TABS
650.0000 mg | ORAL_TABLET | Freq: Once | ORAL | Status: AC
  Administered 2021-05-20: 650 mg via ORAL
  Filled 2021-05-20: qty 2

## 2021-05-20 MED ORDER — METHYLPREDNISOLONE SODIUM SUCC 125 MG IJ SOLR: 125.0000 mg | Freq: Once | INTRAMUSCULAR | Status: AC | PRN

## 2021-05-20 MED ORDER — DIPHENHYDRAMINE HCL 50 MG/ML IJ SOLN: 50.0000 mg | Freq: Once | INTRAMUSCULAR | Status: AC | PRN

## 2021-05-20 NOTE — Progress Notes (Unsigned)
Diagnosis: Multiple Sclerosis  Provider:  Marshell Garfinkel, MD  Procedure: Infusion  IV Type: Peripheral, IV Location: L Antecubital  Tysabri (Natalizumab), Dose: 300 mg  Infusion Start Time: 7953  Infusion Stop Time: 6922  Post Infusion IV Care: Observation period completed and Peripheral IV Discontinued  Discharge: Condition: Good, Destination: Home . AVS provided to patient.   Performed by:  Ludwig Lean, RN

## 2021-06-17 ENCOUNTER — Ambulatory Visit: Payer: BC Managed Care – PPO

## 2021-06-19 ENCOUNTER — Other Ambulatory Visit: Payer: Self-pay

## 2021-06-19 ENCOUNTER — Ambulatory Visit (INDEPENDENT_AMBULATORY_CARE_PROVIDER_SITE_OTHER): Payer: BC Managed Care – PPO

## 2021-06-19 VITALS — BP 106/72 | HR 98 | Temp 98.3°F | Resp 18 | Ht 64.0 in | Wt 170.8 lb

## 2021-06-19 DIAGNOSIS — G35 Multiple sclerosis: Secondary | ICD-10-CM

## 2021-06-19 MED ORDER — METHYLPREDNISOLONE SODIUM SUCC 125 MG IJ SOLR: 125.0000 mg | Freq: Once | INTRAMUSCULAR | Status: DC | PRN

## 2021-06-19 MED ORDER — DIPHENHYDRAMINE HCL 50 MG/ML IJ SOLN: 50.0000 mg | Freq: Once | INTRAMUSCULAR | Status: DC | PRN

## 2021-06-19 MED ORDER — SODIUM CHLORIDE 0.9 % IV SOLN: Freq: Once | INTRAVENOUS | Status: DC | PRN

## 2021-06-19 MED ORDER — SODIUM CHLORIDE 0.9 % IV SOLN
300.0000 mg | Freq: Once | INTRAVENOUS | Status: AC
  Administered 2021-06-19: 300 mg via INTRAVENOUS
  Filled 2021-06-19: qty 15

## 2021-06-19 MED ORDER — ACETAMINOPHEN 325 MG PO TABS: 650.0000 mg | ORAL_TABLET | Freq: Once | ORAL | Status: DC

## 2021-06-19 MED ORDER — FAMOTIDINE IN NACL 20-0.9 MG/50ML-% IV SOLN: 20.0000 mg | Freq: Once | INTRAVENOUS | Status: DC | PRN

## 2021-06-19 MED ORDER — ALBUTEROL SULFATE HFA 108 (90 BASE) MCG/ACT IN AERS: 2.0000 | INHALATION_SPRAY | Freq: Once | RESPIRATORY_TRACT | Status: DC | PRN

## 2021-06-19 MED ORDER — LORATADINE 10 MG PO TABS: 10.0000 mg | ORAL_TABLET | Freq: Once | ORAL | Status: DC

## 2021-06-19 MED ORDER — SODIUM CHLORIDE 0.9 % IV SOLN: INTRAVENOUS | Status: DC

## 2021-06-19 MED ORDER — EPINEPHRINE 0.3 MG/0.3ML IJ SOAJ: 0.3000 mg | Freq: Once | INTRAMUSCULAR | Status: DC | PRN

## 2021-06-19 NOTE — Progress Notes (Signed)
Diagnosis: Multiple Sclerosis ? ?Provider:  Marshell Garfinkel, MD ? ?Procedure: Infusion ? ?IV Type: Peripheral, IV Location: L Antecubital ? ?Tysabri (Natalizumab), Dose: 300 mg ? ?Infusion Start Time: 4496 06/19/2021 ? ?Infusion Stop Time: 7591 06/19/2021 ? ?Post Infusion IV Care: 60 minutes Observation period completed and Peripheral IV Discontinued ? ?Discharge: Condition: Good, Destination: Home . AVS provided to patient.  ? ?Performed by:  Raden Byington, RN  ?  ?

## 2021-06-25 ENCOUNTER — Encounter: Payer: Self-pay | Admitting: Nurse Practitioner

## 2021-06-25 ENCOUNTER — Other Ambulatory Visit: Payer: Self-pay | Admitting: Nurse Practitioner

## 2021-06-25 MED ORDER — ALPRAZOLAM 0.25 MG PO TABS: 0.2500 mg | ORAL_TABLET | Freq: Two times a day (BID) | ORAL | 1 refills | Status: AC | PRN

## 2021-06-25 NOTE — Progress Notes (Signed)
Reviewed PDMP profile -  ?Overdose risk score 250 ?Last fill 02/2021.  ?

## 2021-06-26 ENCOUNTER — Telehealth: Payer: Self-pay | Admitting: Neurology

## 2021-06-26 NOTE — Telephone Encounter (Signed)
Patient called to report she went in for a six month follow up with her oncologist and they found another spot. ? ?She is going for a PET scan on Monday afternoon. ? ?Patient stated she is currently taking her Tysabri but may have to stop it again depending on the results. ? ?She wants Dr. Tomi Likens to be aware of all of this. ?

## 2021-06-30 NOTE — Telephone Encounter (Signed)
Spoke to patient, Per patient she was advised that there is another spot on her other lung . Radiation and Chemo will not work. ?Patient has an appt on Thursday to see when she can do a Bronchostomy. Pt to have it done by time she is scheduled for Tysbari infusion on 07/17/21. ?

## 2021-07-08 HISTORY — PX: LYMPH NODE BIOPSY: SHX201

## 2021-07-17 ENCOUNTER — Ambulatory Visit (INDEPENDENT_AMBULATORY_CARE_PROVIDER_SITE_OTHER): Payer: BC Managed Care – PPO

## 2021-07-17 VITALS — BP 101/72 | HR 91 | Temp 97.6°F | Resp 18 | Ht 64.0 in | Wt 172.0 lb

## 2021-07-17 DIAGNOSIS — G35 Multiple sclerosis: Secondary | ICD-10-CM | POA: Diagnosis not present

## 2021-07-17 LAB — CBC WITH DIFFERENTIAL/PLATELET
Absolute Monocytes: 782 cells/uL (ref 200–950)
Basophils Absolute: 34 cells/uL (ref 0–200)
Basophils Relative: 0.4 %
Eosinophils Absolute: 128 cells/uL (ref 15–500)
Eosinophils Relative: 1.5 %
HCT: 36.5 % (ref 35.0–45.0)
Hemoglobin: 12.2 g/dL (ref 11.7–15.5)
Lymphs Abs: 3324 cells/uL (ref 850–3900)
MCH: 29.7 pg (ref 27.0–33.0)
MCHC: 33.4 g/dL (ref 32.0–36.0)
MCV: 88.8 fL (ref 80.0–100.0)
MPV: 9.8 fL (ref 7.5–12.5)
Monocytes Relative: 9.2 %
Neutro Abs: 4233 cells/uL (ref 1500–7800)
Neutrophils Relative %: 49.8 %
Platelets: 301 10*3/uL (ref 140–400)
RBC: 4.11 10*6/uL (ref 3.80–5.10)
RDW: 13.1 % (ref 11.0–15.0)
Total Lymphocyte: 39.1 %
WBC: 8.5 10*3/uL (ref 3.8–10.8)

## 2021-07-17 MED ORDER — SODIUM CHLORIDE 0.9 % IV SOLN: INTRAVENOUS | Status: DC

## 2021-07-17 MED ORDER — ACETAMINOPHEN 325 MG PO TABS: 650.0000 mg | ORAL_TABLET | Freq: Once | ORAL | Status: DC

## 2021-07-17 MED ORDER — LORATADINE 10 MG PO TABS: 10.0000 mg | ORAL_TABLET | Freq: Once | ORAL | Status: DC

## 2021-07-17 MED ORDER — SODIUM CHLORIDE 0.9 % IV SOLN
300.0000 mg | Freq: Once | INTRAVENOUS | Status: AC
  Administered 2021-07-17: 300 mg via INTRAVENOUS
  Filled 2021-07-17: qty 15

## 2021-07-17 NOTE — Progress Notes (Signed)
Diagnosis: Multiple Sclerosis ? ?Provider:  Marshell Garfinkel, MD ? ?Procedure: Infusion, Labs collected ? ?IV Type: Peripheral, IV Location: L Antecubital ? ?Tysabri (Natalizumab), Dose: 300 mg ? ?Infusion Start Time: 1240 ? ?Infusion Stop Time: 4835 ? ?Post Infusion IV Care: Patient declined observation and Peripheral IV Discontinued ? ?Discharge: Condition: Good, Destination: Home . AVS provided to patient.  ? ?Performed by:  Koren Shiver, RN  ?  ?

## 2021-07-18 LAB — COMPLETE METABOLIC PANEL WITH GFR
AG Ratio: 2.2 (calc) (ref 1.0–2.5)
ALT: 14 U/L (ref 6–29)
AST: 13 U/L (ref 10–30)
Albumin: 4 g/dL (ref 3.6–5.1)
Alkaline phosphatase (APISO): 58 U/L (ref 31–125)
BUN: 13 mg/dL (ref 7–25)
CO2: 25 mmol/L (ref 20–32)
Calcium: 9.1 mg/dL (ref 8.6–10.2)
Chloride: 107 mmol/L (ref 98–110)
Creat: 0.83 mg/dL (ref 0.50–0.99)
Globulin: 1.8 g/dL (calc) — ABNORMAL LOW (ref 1.9–3.7)
Glucose, Bld: 95 mg/dL (ref 65–99)
Potassium: 4.2 mmol/L (ref 3.5–5.3)
Sodium: 139 mmol/L (ref 135–146)
Total Bilirubin: 0.5 mg/dL (ref 0.2–1.2)
Total Protein: 5.8 g/dL — ABNORMAL LOW (ref 6.1–8.1)
eGFR: 91 mL/min/{1.73_m2} (ref >=60)

## 2021-07-23 ENCOUNTER — Encounter: Payer: Self-pay | Admitting: Neurology

## 2021-07-24 LAB — STRATIFY JCV AB (W/ INDEX) W/ RFLX
Index Value: 0.13
Stratify JCV (TM) Ab w/Reflex Inhibition: NEGATIVE

## 2021-07-29 NOTE — Telephone Encounter (Signed)
[employer] called in and left a message with the access nurse. They are trying to obtain a rea-authorization questionnaire for Tysabri. It expires today 07/29/21. Full access nurse note is in Dr. Georgie Chard box. ?

## 2021-07-31 ENCOUNTER — Telehealth: Payer: Self-pay | Admitting: Neurology

## 2021-07-31 NOTE — Telephone Encounter (Signed)
Michelle from [employer] called and stated she needs verbal orders for a PT order.  Callback number is [phone]  ext [phone]. ?

## 2021-08-03 NOTE — Progress Notes (Signed)
? ?NEUROLOGY FOLLOW UP OFFICE NOTE ? ?Donna Higgins ?[id number] ? ?Assessment/Plan:  ? 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 ?She may have had MS breakthrough on Tysabri.  She has recurrence of atypical carcinoid which would limit use of DMT.  ?  ?Given the complexity of her condition, I will refer her to Dr. Arlice Colt, an MS specialist here in [place]. ?In the meantime, she will continue Tysabri and D3 ?Advised to bring copy of the MRI on CD to her visit with Dr. Felecia Shelling. ? ?  ?  ?Subjective:  ?Donna Higgins is a 41 year old right-handed female who follows up for multiple sclerosis.  Oncology notes reviewed.  She is accompanied by her mother. ?  ?UPDATE: ?DMT:  Tysabri ?Other medications:  D3 4000 IU daily ? ?07/23/2021 LABS:  CBC with WBC 8.5, HGB 12.2, HCT 36.5, PLT 301, ALC 3,324; CMP with Na 139, K 4.2, Cl 107, CO2 25, Ca 9.1, glucose 95, BUN 13, Cr 0.83, t bili 0.5, ALP 58, AST 13, ALT 14; JCV ab negative ? ?She was found to have regional recurrence involving mediastinal lymph nodes.  She saw [place].  MRI of brain with and without contrast on 07/21/2021 personally reviewed showed few tiny infratentorial enhancing foci involving the mid pons, superior right cerebellum and inferior central portion of the cerebellar supratonsillar region.  Oncology believes this represents MS rather than metastatic disease.  She is starting radiation and cisplatin and etoposide beginning tomorrow. ? ?Physically she is feeling well ?Vision:  no issues ?Motor:  no issues ?Sensory:  no issues ?Pain:  no issues ?Gait:  no issues ?Bowel/Bladder:  no issues ? ? ?  ?HISTORY: ?She saw her ophthalmologist on 07/28/2020 for three days of worsening blurred vision and pressure in her right eye.  No headache or ocular pain.  Her ophthalmologist noted possible optic neuritis and sent her to the ED for imaging.  She was seen in the ED at [facility] where MRI of brain and orbits with and without contrast showed several hyperintense foci in the cerebral white matter bilaterally, most notable in the right frontal and temporoparietal regions but no abnormal enhancement.  There was also mild asymmetric edema and enhancement of the right optic nerve.  She was transferred to [facility] for management where she received 3 day course of Solu-Medrol 1gm for 3 days.  MRI of cervical and thoracic spine with and without contrast showed small non-enhancing lesion at C2 level and T11 level.   She underwent LP which demonstrated CSF cell count 0, protein 31, glucose 106, IgG 2.7, negative culture, negative measles/rubella/mumps/VZV/HSV 1&2/West Nile , ACE negative, nonreactive VDRL, negative Lyme.  Serum labs included negative NMO/AQP4, negative anti-MOG, sed rate 1, CRP <3, non-reactive RPR, negative HIV, B12 437, folate 8.2, TSH 0.13, and negative acute viral hepatitis panel (HAV, HBV, HCV).  CBC with WBC 10.1, HGB 12.7, HCT 38.6, PLT 342, ALC 3; CMP with Na 140, K 3.8, Cl 106, CO2 23, glucose 93, BUN 13, Cr 0.66, Ca 9.4, ALP 66, t bili 0.26, AST 16, ALT <5.  She was discharged on prednisone taper.  Monday morning is last dose of prednisone.  Still with mild blurred but significantly improved.  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  Plan was to  start either Tysabri or Ocrevus.  Labs in April 2022 demonstrated negative JCV ab.  IgA was 113, IgG 876 and IgM 88.  Vit D level was 29.10 and she was advised to start D3 4000 IU daily.  She followed up with pulmonology regarding the lung nodule and was subsequently diagnosed with neuroendocrine carcinoma of the lung.  Initiation of DMT was put on-hold.  She followed up with oncology and workup for metastatic disease (including PET) was negative.  Repeat MRI of brain with and  without contrast on 10/06/2020 personally reviewed showed evidence of chronic demyelinating lesions but no leptomeningeal enhancement or other evidence of metastatic disease.  She underwent a left pneumonectomy on 12/05/2020.  Margins were all clear and she had been determined to be cancer-free with no oncology follow up needed.  Immunosuppressant therapy such as chemo, not indicated.  However, last week, she developed recurrence of blurred vision in right eye.  Due to recent surgery, her surgeon cautioned her about using steroids.  She was explained the risks of IV steroids soon after surgery but weighing risks and benefits, she underwent 3 days of IV Solum-Medrol; 1mg  daily due to risk of permanent vision loss.  She continues to have blurred vision in the right eye, albeit slightly improved.  ?  ?Denies history of other neurologic conditions such as migraine.  No known family history of MS.  Her father was adopted.   ? ?PAST MEDICAL HISTORY: ?Past Medical History:  ?Diagnosis Date  ? Decreased libido   ? History of chicken pox   ? Internal hemorrhoids without mention of complication   ? Neuromuscular disorder ([place])   ? Phreesia 08/05/2020, dx MS in April  ? ? ?MEDICATIONS: ?Current Outpatient Medications on File Prior to Visit  ?Medication Sig Dispense Refill  ? ALPRAZolam (XANAX) 0.25 MG tablet Take 1 tablet (0.25 mg total) by mouth 2 (two) times daily as needed for anxiety. 45 tablet 1  ? Beta Carotene (VITAMIN A) 25000 UNIT capsule Take 25,000 Units by mouth daily.    ? cholecalciferol (VITAMIN D3) 25 MCG (1000 UNIT) tablet Take 1,000 Units by mouth daily.    ? hydrOXYzine (ATARAX/VISTARIL) 10 MG tablet Take 1 tablet (10 mg total) by mouth 2 (two) times daily as needed for anxiety. (Patient not taking: Reported on 01/07/2021) 45 tablet 1  ? naproxen (NAPROSYN) 375 MG tablet Take 1 tablet (375 mg total) by mouth 2 (two) times daily with a meal. (Patient not taking: Reported on 01/07/2021) 20 tablet 0  ?  oxyCODONE-acetaminophen (PERCOCET/ROXICET) 5-325 MG tablet Take 1 tablet by mouth every 6 (six) hours as needed for severe pain. (Patient not taking: Reported on 01/07/2021) 10 tablet 0  ? ?Current Facility-Administered Medications on File Prior to Visit  ?Medication Dose Route Frequency Provider Last Rate Last Admin  ? 0.9 %  sodium chloride infusion   Intravenous Continuous Sohana Austell R, DO      ? 0.9 %  sodium chloride infusion   Intravenous Once PRN Tomi Likens, Mikya Don R, DO      ? albuterol (VENTOLIN HFA) 108 (90 Base) MCG/ACT inhaler 2 puff  2 puff Inhalation Once PRN Metta Clines R, DO      ? diphenhydrAMINE (BENADRYL) injection 50 mg  50 mg Intravenous Once PRN Metta Clines R, DO      ? EPINEPHrine (EPI-PEN) injection 0.3 mg  0.3 mg Intramuscular Once PRN Metta Clines R, DO      ? famotidine (PEPCID) IVPB 20 mg premix  20  mg Intravenous Once PRN Metta Clines R, DO      ? methylPREDNISolone sodium succinate (SOLU-MEDROL) 125 mg/2 mL injection 125 mg  125 mg Intravenous Once PRN Metta Clines R, DO      ? ? ?ALLERGIES: ?Allergies  ?Allergen Reactions  ? Stadol [Butorphanol Tartrate]   ?  Legs twitching, nausea  ? ? ?FAMILY HISTORY: ?Family History  ?Problem Relation Age of Onset  ? Urolithiasis Mother   ? Hypertension Mother   ? Hyperlipidemia Mother   ? Cancer Maternal Aunt   ?     breast  ? Diabetes Maternal Grandmother   ? ? ?  ?Objective:  ?Blood pressure 94/65, pulse (!) 103, height 5\' 4"  (1.626 m), weight 172 lb 9.6 oz (78.3 kg), SpO2 99 %. ?General: No acute distress.  Patient appears well-groomed.   ?Head:  Normocephalic/atraumatic ?Eyes:  Fundi examined but not visualized ?Neck: supple, no paraspinal tenderness, full range of motion ?Heart:  Regular rate and rhythm ?Back: No paraspinal tenderness ?Neurological Exam: alert and oriented to person, place, and time.  Speech fluent and not dysarthric, language intact.  CN II-XII intact. Bulk and tone normal, muscle strength 5/5 throughout.  Sensation to pinprick and  vibration intact.  Deep tendon reflexes 2+ throughout, toes downgoing.  Finger to nose testing intact.  Gait normal, Romberg negative. ? ? ?Metta Clines, DO ? ?CC: Leretha Pol, NP ? ? ? ? ? ? ?

## 2021-08-03 NOTE — Telephone Encounter (Signed)
Please call the office back.  ?

## 2021-08-04 ENCOUNTER — Encounter: Payer: Self-pay | Admitting: Neurology

## 2021-08-04 ENCOUNTER — Ambulatory Visit: Payer: BC Managed Care – PPO | Admitting: Neurology

## 2021-08-04 VITALS — BP 94/65 | HR 103 | Ht 64.0 in | Wt 172.6 lb

## 2021-08-04 DIAGNOSIS — C7A8 Other malignant neuroendocrine tumors: Secondary | ICD-10-CM | POA: Diagnosis not present

## 2021-08-04 DIAGNOSIS — G35 Multiple sclerosis: Secondary | ICD-10-CM | POA: Diagnosis not present

## 2021-08-04 NOTE — Patient Instructions (Signed)
I will refer you to Dr. Arlice Colt, an MS specialist at [facility] ?In the meantime, we will continue Tysabri and the vit D3 ?Be sure to bring a copy of the brain MRI on CD when you see him. ?Contact me with any questions or concerns ?

## 2021-08-05 ENCOUNTER — Encounter: Payer: Self-pay | Admitting: Neurology

## 2021-08-13 ENCOUNTER — Telehealth: Payer: Self-pay | Admitting: Neurology

## 2021-08-13 ENCOUNTER — Encounter: Payer: Self-pay | Admitting: Neurology

## 2021-08-13 ENCOUNTER — Ambulatory Visit: Payer: BC Managed Care – PPO | Admitting: Neurology

## 2021-08-13 VITALS — BP 113/69 | HR 107 | Ht 64.0 in | Wt 171.5 lb

## 2021-08-13 DIAGNOSIS — G35 Multiple sclerosis: Secondary | ICD-10-CM | POA: Diagnosis not present

## 2021-08-13 DIAGNOSIS — Z79899 Other long term (current) drug therapy: Secondary | ICD-10-CM

## 2021-08-13 DIAGNOSIS — H469 Unspecified optic neuritis: Secondary | ICD-10-CM | POA: Diagnosis not present

## 2021-08-13 DIAGNOSIS — C7A8 Other malignant neuroendocrine tumors: Secondary | ICD-10-CM | POA: Diagnosis not present

## 2021-08-13 NOTE — Progress Notes (Signed)
? ?[facility] ? ?PATIENT: Donna Higgins ?DOB: [birth year] ? ?REFERRING DOCTOR OR PCP: Metta Clines, DO [facility] neurology); Leretha Pol, NP (PCP) ?SOURCE: Patient, notes from Dr. Tomi Likens, note from [facility] oncology, imaging and lab results, MRI images personally reviewed. ? ?_________________________________ ? ? ?HISTORICAL ? ?CHIEF COMPLAINT:  ?Chief Complaint  ?Patient presents with  ? New Patient (Initial Visit)  ?  RM 2 with sister. Referral from Dr. Tomi Likens for MS. Currently on Tysbari.   She has recurrence of atypical carcinoid. Here to discuss other therapy options d/t possible breakthrough MS activity while on Tysabri. Wanting to know if spots on MRI MS or cancer. Unsure which one at this point. Had LP via [facility] 07/2020.  ? ? ?HISTORY OF PRESENT ILLNESS:  ?I had the pleasure seeing your patient, Donna Higgins, at the [place] at [facility] for neurologic consultation regarding her multiple sclerosis and therapy options. ? ?She is a 41 year old woman who was diagnosed with MS in April 2022 who also has a neuroendocrine tumor (carcinoid).  Recent MRI of the brain shows 3 small enhancing lesions though unclear if these represent demyelinating plaque or metastasis. ? ?She presented with right optic neuritis in April 2022.  She had several days of right eye pain and reduced right vision and saw ophthalmology.  She was found to have possible right optic neuritis and was sent to the [facility] emergency room for emergent imaging.  MRI of the orbits showed right optic neuritis.  The  MRI of the brain that were nonspecific.  She was admitted for IV steroids.  The cervical spine and thoracic spine MRI performed 07/30/2020 also showed some additional foci in the ventral spinal cord adjacent to T11, towards the right adjacent to C2 and possibly to the right adjacent to C5.  The MRI of the thoracic spine also showed adenopathy or mass in the subcarinal mediastinum and left hilar region with  left lung field changes.  She had a lumbar puncture that showed no evidence of Lyme, HSV, VZV.  Standard CSF labs and ACE were normal.  Unfortunately, I see no record of an oligoclonal bands.  She also had anti-NMO and anti-MOG antibodies that were both negative.  CT scan 08/15/2020 of the chest showed mediastinal and hilar adenopathy and nodules within the left lung.  PET scan did not show additional foci of hypermetabolism.  Repeat MRI of the brain 10/06/2020 showed no metastatic disease and mild burden of demyelinating plaque. ? ?She saw Dr. Tomi Likens 08/07/2020 and was started on Tysabri October 2022.Marland Kitchen  JCV antibody was negative. ? ?She had a biopsy followed by left pneumonectomy.    In March 2023 she had a CT scan that showed extensive lymphadenopathy and a possible 5 mm right middle lobe nodule.  PET scan 06/29/2021 confirmed multiple hypermetabolic mediastinal lymphadenopathy. ? ?MRI of the brain 07/21/2021 showed 3 small enhancing foci in the right anterior pons, right superior cerebellum and left inferior cerebellum.  Of note, the 2 cerebellar foci were not clearly evident on FLAIR or T2 weighted imaging.  The foci could represent acute demyelinating plaque or metastasis. ? ?Since that time she has started radiation treatments to the chest and also is on etoposide and cis-platen chemotherapy since last week.   ? ?Dr. Tomi Likens has asked me to evaluate Donna Higgins to help determine best course for her MS and to weigh in on the etiology of the enhancing lesions.    ? ?Currently, gait, balance, strength and sensation are normal.  She continues to have some mild visual symptoms on the right with color desaturation and mildly reduced acuity.  Bladder function is fine. ? ?Imaging: ?MRI of the brain 07/21/2021 shows T2/FLAIR hyperintense foci in the hemispheres in the periventricular and deep white matter.  A focus was also present in the pons and right cerebellar hemisphere..  The 3 mm focus in the right anterior pons enhances  after contrast.  There is not associated edema.  Additionally, there are small enhancing focus in the left superior cerebellar without clear abnormality on T2 weighted or FLAIR images ? ?MRI of the cervical spine (report) 07/30/2020 reportedly showed a focus to the right at C2 and perhaps a smaller focus to the right at C5.  These did not enhance. ? ?MRI of the thoracic spine (report) 07/30/2020 shows a focus at T11 consistent with MS and also showed high lower adenopathy and left lung field changes prompting further evaluation that led to the carcinoid diagnosis. ? ?MRI of the brain and orbits 07/28/2020 (by report) showed enhancement of the right optic nerve consistent with optic neuritis.  There were a few scattered T2/FLAIR hyperintense foci in the hemispheres, mostly nonspecific. ? ?Labs: ?April 2022: CSF showed normal protein, glucose, cell count, viral panels and ACE.  Unfortunately, I did not see evidence that oligoclonal bands were performed. ? ?Anti-MOG, anti-NMO negative.  JCV antibody negative ? ? ?REVIEW OF SYSTEMS: ?Constitutional: No fevers, chills, sweats, or change in appetite ?Eyes: No visual changes, double vision, eye pain ?Ear, nose and throat: No hearing loss, ear pain, nasal congestion, sore throat ?Cardiovascular: No chest pain, palpitations ?Respiratory:  No shortness of breath at rest or with exertion.   No wheezes ?GastrointestinaI: No nausea, vomiting, diarrhea, abdominal pain, fecal incontinence ?Genitourinary:  No dysuria, urinary retention or frequency.  No nocturia. ?Musculoskeletal:  No neck pain, back pain ?Integumentary: No rash, pruritus, skin lesions ?Neurological: as above ?Psychiatric: No depression at this time.  No anxiety ?Endocrine: No palpitations, diaphoresis, change in appetite, change in weigh or increased thirst ?Hematologic/Lymphatic:  No anemia, purpura, petechiae. ?Allergic/Immunologic: No itchy/runny eyes, nasal congestion, recent allergic reactions,  rashes ? ?ALLERGIES: ?Allergies  ?Allergen Reactions  ? Stadol [Butorphanol Tartrate]   ?  Legs twitching, nausea  ? ? ?HOME MEDICATIONS: ? ?Current Outpatient Medications:  ?  ALPRAZolam (XANAX) 0.25 MG tablet, Take 1 tablet (0.25 mg total) by mouth 2 (two) times daily as needed for anxiety., Disp: 45 tablet, Rfl: 1 ?  Beta Carotene (VITAMIN A) 25000 UNIT capsule, Take 25,000 Units by mouth daily., Disp: , Rfl:  ?  cholecalciferol (VITAMIN D3) 25 MCG (1000 UNIT) tablet, Take 2,000 Units by mouth daily., Disp: , Rfl:  ?  dexamethasone (DECADRON) 4 MG tablet, Take 4 mg by mouth. Takes 1 tablet each day before she starts chemotherapy( 4 days every 3 weeks), Disp: , Rfl:  ?  natalizumab (TYSABRI) 300 MG/15ML injection, Inject 300 mg into the vein every 28 (twenty-eight) days., Disp: , Rfl:  ?  OLANZapine (ZYPREXA) 5 MG tablet, Take 5 mg by mouth at bedtime. 4 nights every three weeks, takes 5mg , Disp: , Rfl:  ?  omeprazole (PRILOSEC) 20 MG capsule, Take 20 mg by mouth daily., Disp: , Rfl:  ?  prochlorperazine (COMPAZINE) 10 MG tablet, Take 10 mg by mouth every 6 (six) hours as needed., Disp: , Rfl:  ?No current facility-administered medications for this visit. ? ?Facility-Administered Medications Ordered in Other Visits:  ?  0.9 %  sodium chloride infusion, ,  Intravenous, Continuous, Jaffe, Adam R, DO ?  0.9 %  sodium chloride infusion, , Intravenous, Once PRN, Jaffe, Adam R, DO ?  albuterol (VENTOLIN HFA) 108 (90 Base) MCG/ACT inhaler 2 puff, 2 puff, Inhalation, Once PRN, Tomi Likens, Adam R, DO ?  diphenhydrAMINE (BENADRYL) injection 50 mg, 50 mg, Intravenous, Once PRN, Tomi Likens, Adam R, DO ?  EPINEPHrine (EPI-PEN) injection 0.3 mg, 0.3 mg, Intramuscular, Once PRN, Tomi Likens, Adam R, DO ?  famotidine (PEPCID) IVPB 20 mg premix, 20 mg, Intravenous, Once PRN, Tomi Likens, Adam R, DO ?  methylPREDNISolone sodium succinate (SOLU-MEDROL) 125 mg/2 mL injection 125 mg, 125 mg, Intravenous, Once PRN, Pieter Partridge, DO ? ?PAST MEDICAL  HISTORY: ?Past Medical History:  ?Diagnosis Date  ? Decreased libido   ? History of chicken pox   ? Internal hemorrhoids without mention of complication   ? Neuromuscular disorder ([place])   ? Phreesia 08/05/2020, dx MS in April  ? ?

## 2021-08-13 NOTE — Telephone Encounter (Signed)
LVM and sent mychart informing pt of 4 week appt scheduled for 09/14/21. Pt did not stop at check out after today's appt. ?

## 2021-08-14 ENCOUNTER — Ambulatory Visit: Payer: BC Managed Care – PPO

## 2021-08-17 ENCOUNTER — Ambulatory Visit (INDEPENDENT_AMBULATORY_CARE_PROVIDER_SITE_OTHER): Payer: BC Managed Care – PPO

## 2021-08-17 VITALS — BP 96/72 | HR 94 | Temp 97.8°F | Resp 16 | Ht 64.0 in | Wt 173.2 lb

## 2021-08-17 DIAGNOSIS — G35 Multiple sclerosis: Secondary | ICD-10-CM | POA: Diagnosis not present

## 2021-08-17 MED ORDER — ACETAMINOPHEN 325 MG PO TABS: 650.0000 mg | ORAL_TABLET | Freq: Once | ORAL | Status: DC

## 2021-08-17 MED ORDER — SODIUM CHLORIDE 0.9 % IV SOLN
300.0000 mg | Freq: Once | INTRAVENOUS | Status: AC
  Administered 2021-08-17: 300 mg via INTRAVENOUS
  Filled 2021-08-17: qty 15

## 2021-08-17 MED ORDER — LORATADINE 10 MG PO TABS: 10.0000 mg | ORAL_TABLET | Freq: Once | ORAL | Status: DC

## 2021-08-17 NOTE — Progress Notes (Signed)
Diagnosis: Multiple Sclerosis ? ?Provider:  Marshell Garfinkel, MD ? ?Procedure: Infusion ? ?IV Type: Peripheral, IV Location: R Antecubital ? ?Tysabri (Natalizumab), Dose: 300 mg ? ?Infusion Start Time: 3295 ? ?Infusion Stop Time: 1500 ? ?Post Infusion IV Care: Patient declined observation and Peripheral IV Discontinued ? ?Discharge: Condition: Good, Destination: Home . AVS provided to patient.  ? ?Performed by:  Koren Shiver, RN  ?  ?

## 2021-08-22 LAB — NATALIZUMAB DRUG + ANTIBODY
Anti-Natalizumab Antibody: <23 ng/mL
Natalizumab Drug Level: 1.7 ug/mL

## 2021-08-24 ENCOUNTER — Encounter: Payer: Self-pay | Admitting: Neurology

## 2021-08-25 ENCOUNTER — Telehealth: Payer: Self-pay | Admitting: Neurology

## 2021-08-25 NOTE — Telephone Encounter (Signed)
45 mins MRI Brain w/wo contrast Dr. Cheree Ditto Josem Kaufmann: [id number] exp. 08/18/21-09/16/21 scheduled at [facility] 09/02/21 at 3:30pm ?

## 2021-08-28 ENCOUNTER — Encounter: Payer: Self-pay | Admitting: Neurology

## 2021-09-02 ENCOUNTER — Other Ambulatory Visit: Payer: BC Managed Care – PPO

## 2021-09-02 ENCOUNTER — Encounter: Payer: Self-pay | Admitting: Neurology

## 2021-09-08 ENCOUNTER — Encounter: Payer: Self-pay | Admitting: Neurology

## 2021-09-08 NOTE — Telephone Encounter (Signed)
The patient just called me and said she wants to get her MRI done at [facility] on June 8 and cancelled the one that was scheduled here 09/09/21. We moved out her office visit to the end on June and she will make sure [name] sends [place] her MRI results before her visit with Dr. Felecia Shelling.

## 2021-09-09 ENCOUNTER — Other Ambulatory Visit: Payer: BC Managed Care – PPO

## 2021-09-14 ENCOUNTER — Ambulatory Visit: Payer: BC Managed Care – PPO | Admitting: Neurology

## 2021-09-14 ENCOUNTER — Ambulatory Visit: Payer: BC Managed Care – PPO

## 2021-09-15 ENCOUNTER — Ambulatory Visit (INDEPENDENT_AMBULATORY_CARE_PROVIDER_SITE_OTHER): Payer: BC Managed Care – PPO

## 2021-09-15 VITALS — BP 94/60 | HR 68 | Temp 98.2°F | Resp 16 | Ht 64.0 in | Wt 175.4 lb

## 2021-09-15 DIAGNOSIS — G35 Multiple sclerosis: Secondary | ICD-10-CM

## 2021-09-15 MED ORDER — SODIUM CHLORIDE 0.9 % IV SOLN
300.0000 mg | Freq: Once | INTRAVENOUS | Status: AC
  Administered 2021-09-15: 300 mg via INTRAVENOUS
  Filled 2021-09-15: qty 15

## 2021-09-15 MED ORDER — LORATADINE 10 MG PO TABS: 10.0000 mg | ORAL_TABLET | Freq: Once | ORAL | Status: DC

## 2021-09-15 MED ORDER — ACETAMINOPHEN 325 MG PO TABS: 650.0000 mg | ORAL_TABLET | Freq: Once | ORAL | Status: DC

## 2021-09-15 MED ORDER — SODIUM CHLORIDE 0.9 % IV SOLN: INTRAVENOUS | Status: DC

## 2021-09-15 NOTE — Progress Notes (Signed)
Diagnosis: Multiple Sclerosis  Provider:  Marshell Garfinkel, MD  Procedure: Infusion  IV Type: Peripheral, IV Location: R Antecubital  Tysabri (Natalizumab), Dose: 300 mg  Infusion Start Time: 1165  Infusion Stop Time: 7903  Post Infusion IV Care: Patient declined observation and Peripheral IV Discontinued  Discharge: Condition: Good, Destination: Home . AVS provided to patient.   Performed by:  Adelina Mings, LPN

## 2021-09-23 ENCOUNTER — Telehealth: Payer: Self-pay | Admitting: *Deleted

## 2021-09-23 NOTE — Telephone Encounter (Signed)
Received Cd [name]. CD on [employer]

## 2021-09-24 ENCOUNTER — Telehealth: Payer: Self-pay | Admitting: Neurology

## 2021-09-24 NOTE — Telephone Encounter (Signed)
I spoke with Donna Higgins about the brain MRI.  It is essentially unchanged to the MRI she had 2 months ago showing 3 small enhancing lesions (pons, left superior cerebellar peduncle and cerebellar vermis).  Additionally, there are about 6 scattered nonenhancing white matter foci.  The nonenhancing lesions are very consistent with MS.  The enhancing lesions could represent either metastasis from the neuroendocrine carcinoma or demyelination.   It would be unusual for a demyelinating lesion to still similarly enhance 2 months after the onset and that favors an etiology besides MS.  However, it would also be unusual for the foci to still enhance after chemotherapy if they would do to the tumor.   Therefore, we still at the same dilemma where there the foci are demyelination or tumor.   I will try to reach out to Dr. Marily Memos tomorrow.  Without certainty on etiology, the best course may be to reimage in 2 more months.   She reports that she has completed her radiation but is continuing to get chemotherapy.  She is on cisplatin and etoposide.

## 2021-09-24 NOTE — Telephone Encounter (Signed)
I spoke with Donna Higgins about the brain MRI.  It is essentially unchanged to the MRI she had 2 months ago showing 3 small enhancing lesions (pons, left superior cerebellar peduncle and cerebellar vermis).  Additionally, there are about 6 scattered nonenhancing white matter foci.  The nonenhancing lesions are very consistent with MS.  The enhancing lesions could represent either metastasis from the neuroendocrine carcinoma or demyelination.  It would be unusual for a demyelinating lesion to still similarly enhance 2 months after the onset and that favors an etiology besides MS.  However, it would also be unusual for the foci to still enhance after chemotherapy if they would do to the tumor.  Therefore, we still at the same dilemma where there the foci are demyelination or tumor.  I will try to reach out to Dr. Marily Memos tomorrow.  Without certainty on etiology, the best course may be to reimage in 2 more months.  She reports that she has completed her radiation but is continuing to get chemotherapy.  She is on cisplatin and etoposide.

## 2021-09-25 NOTE — Telephone Encounter (Signed)
I spoke to Dr. Marily Memos.  I discussed that I felt because of the stability of the enhancement after 8 to 9 weeks that I believe that it is less likely that the 3 foci represent demyelination.  She is in the process of chemotherapy and will be continuing.  She will be getting a repeat MRI of the brain in about 6 to 8 weeks and a decision will be made whether or not to do brain radiation based on the appearance.  I also discussed with Donna Higgins.  She had a visit scheduled with me in about 10 days.  However, since she is neurologically stable I have asked her to move that visit to a week or so after her next MRI

## 2021-09-25 NOTE — Telephone Encounter (Signed)
I called [name] and [name] to discuss with Dr. Marily Memos.  I left a message and also sent an email.   I believe the foci are more likely to represent metastasis than demyelination.

## 2021-10-06 ENCOUNTER — Ambulatory Visit: Payer: BC Managed Care – PPO | Admitting: Neurology

## 2021-10-14 ENCOUNTER — Ambulatory Visit (INDEPENDENT_AMBULATORY_CARE_PROVIDER_SITE_OTHER): Payer: BC Managed Care – PPO

## 2021-10-14 VITALS — BP 93/60 | HR 102 | Temp 98.0°F | Resp 16 | Ht 64.0 in | Wt 172.8 lb

## 2021-10-14 DIAGNOSIS — G35 Multiple sclerosis: Secondary | ICD-10-CM | POA: Diagnosis not present

## 2021-10-14 MED ORDER — LORATADINE 10 MG PO TABS: 10.0000 mg | ORAL_TABLET | Freq: Once | ORAL | Status: DC

## 2021-10-14 MED ORDER — SODIUM CHLORIDE 0.9 % IV SOLN: INTRAVENOUS | Status: DC

## 2021-10-14 MED ORDER — ACETAMINOPHEN 325 MG PO TABS: 650.0000 mg | ORAL_TABLET | Freq: Once | ORAL | Status: DC

## 2021-10-14 MED ORDER — SODIUM CHLORIDE 0.9 % IV SOLN
300.0000 mg | Freq: Once | INTRAVENOUS | Status: AC
  Administered 2021-10-14: 300 mg via INTRAVENOUS
  Filled 2021-10-14: qty 15

## 2021-10-14 NOTE — Progress Notes (Signed)
Diagnosis: Multiple Sclerosis  Provider:  Marshell Garfinkel, MD  Procedure: Infusion  IV Type: Peripheral, IV Location: L Antecubital  Tysabri (Natalizumab), Dose: 300 mg  Infusion Start Time: 9563  Infusion Stop Time: 1200  Post Infusion IV Care: Patient declined observation and Peripheral IV Discontinued  Discharge: Condition: Good, Destination: Home . AVS provided to patient.   Performed by:  Adelina Mings, LPN

## 2021-11-11 ENCOUNTER — Ambulatory Visit (INDEPENDENT_AMBULATORY_CARE_PROVIDER_SITE_OTHER): Payer: BC Managed Care – PPO

## 2021-11-11 VITALS — BP 103/73 | HR 92 | Temp 97.4°F | Resp 16 | Ht 64.0 in | Wt 173.0 lb

## 2021-11-11 DIAGNOSIS — G35 Multiple sclerosis: Secondary | ICD-10-CM | POA: Diagnosis not present

## 2021-11-11 MED ORDER — SODIUM CHLORIDE 0.9 % IV SOLN
300.0000 mg | Freq: Once | INTRAVENOUS | Status: AC
  Administered 2021-11-11: 300 mg via INTRAVENOUS
  Filled 2021-11-11: qty 15

## 2021-11-11 MED ORDER — LORATADINE 10 MG PO TABS: 10.0000 mg | ORAL_TABLET | Freq: Once | ORAL | Status: DC

## 2021-11-11 MED ORDER — SODIUM CHLORIDE 0.9 % IV SOLN: INTRAVENOUS | Status: DC

## 2021-11-11 MED ORDER — ACETAMINOPHEN 325 MG PO TABS: 650.0000 mg | ORAL_TABLET | Freq: Once | ORAL | Status: DC

## 2021-11-11 NOTE — Progress Notes (Signed)
Diagnosis: Multiple Sclerosis  Provider:  Marshell Garfinkel, MD  Procedure: Infusion  IV Type: Peripheral, IV Location: R Antecubital  Tysabri (Natalizumab), Dose: 300 mg  Infusion Start Time: 2284  Infusion Stop Time: 0698  Post Infusion IV Care: Patient declined observation and Peripheral IV Discontinued  Discharge: Condition: Good, Destination: Home . AVS provided to patient.   Performed by:  Adelina Mings, LPN

## 2021-12-09 ENCOUNTER — Ambulatory Visit (INDEPENDENT_AMBULATORY_CARE_PROVIDER_SITE_OTHER): Payer: BC Managed Care – PPO

## 2021-12-09 VITALS — BP 108/75 | HR 80 | Temp 98.2°F | Resp 18 | Ht 64.0 in | Wt 174.4 lb

## 2021-12-09 DIAGNOSIS — G35 Multiple sclerosis: Secondary | ICD-10-CM | POA: Diagnosis not present

## 2021-12-09 MED ORDER — SODIUM CHLORIDE 0.9 % IV SOLN
300.0000 mg | Freq: Once | INTRAVENOUS | Status: AC
  Administered 2021-12-09: 300 mg via INTRAVENOUS
  Filled 2021-12-09: qty 15

## 2021-12-09 MED ORDER — ACETAMINOPHEN 325 MG PO TABS: 650.0000 mg | ORAL_TABLET | Freq: Once | ORAL | Status: DC

## 2021-12-09 MED ORDER — LORATADINE 10 MG PO TABS: 10.0000 mg | ORAL_TABLET | Freq: Once | ORAL | Status: DC

## 2021-12-09 NOTE — Progress Notes (Signed)
Diagnosis: Multiple Sclerosis  Provider:  Marshell Garfinkel MD  Procedure: Infusion  IV Type: Peripheral, IV Location: L Antecubital  Tysabri (Natalizumab), Dose: 300 mg  Infusion Start Time: 6767   Infusion Stop Time: 1602  Post Infusion IV Care: Patient declined observation and Peripheral IV Discontinued  Discharge: Condition: Good, Destination: Home . AVS Declined  Performed by:  Adelina Mings, LPN

## 2022-01-06 ENCOUNTER — Ambulatory Visit (INDEPENDENT_AMBULATORY_CARE_PROVIDER_SITE_OTHER): Payer: BC Managed Care – PPO

## 2022-01-06 VITALS — BP 106/75 | HR 88 | Temp 97.7°F | Resp 18 | Ht 64.0 in | Wt 175.2 lb

## 2022-01-06 DIAGNOSIS — G35 Multiple sclerosis: Secondary | ICD-10-CM | POA: Diagnosis not present

## 2022-01-06 MED ORDER — SODIUM CHLORIDE 0.9 % IV SOLN: INTRAVENOUS | Status: DC

## 2022-01-06 MED ORDER — LORATADINE 10 MG PO TABS: 10.0000 mg | ORAL_TABLET | Freq: Once | ORAL | Status: DC

## 2022-01-06 MED ORDER — SODIUM CHLORIDE 0.9 % IV SOLN
300.0000 mg | Freq: Once | INTRAVENOUS | Status: AC
  Administered 2022-01-06: 300 mg via INTRAVENOUS
  Filled 2022-01-06: qty 15

## 2022-01-06 MED ORDER — ACETAMINOPHEN 325 MG PO TABS: 650.0000 mg | ORAL_TABLET | Freq: Once | ORAL | Status: DC

## 2022-01-06 NOTE — Progress Notes (Signed)
Diagnosis: Multiple Sclerosis  Provider:  Marshell Garfinkel MD  Procedure: Infusion  IV Type: Peripheral, IV Location: L Antecubital  Tysabri (Natalizumab), Dose: 300 mg  Infusion Start Time: 8757  Infusion Stop Time: 1610  Post Infusion IV Care: Patient declined observation and Peripheral IV Discontinued  Discharge: Condition: Good, Destination: Home . AVS provided to patient.   Performed by:  Adelina Mings, LPN

## 2022-02-03 ENCOUNTER — Telehealth: Payer: Self-pay | Admitting: Pharmacy Technician

## 2022-02-03 NOTE — Telephone Encounter (Signed)
Auth Submission: PENDING Payer: [employer] Medication & CPT/J Code(s) submitted: Tysabri (Natalizumab) [id number] Route of submission (phone, fax, portal): [place] Phone # Fax # Auth type: Buy/Bill Units/visits requested: 300MG  Q4WKS Reference number: [id number] Approval from:  to  at [place]

## 2022-02-04 ENCOUNTER — Other Ambulatory Visit: Payer: Self-pay | Admitting: Pharmacy Technician

## 2022-02-04 ENCOUNTER — Ambulatory Visit: Payer: BC Managed Care – PPO

## 2022-02-04 ENCOUNTER — Encounter: Payer: Self-pay | Admitting: Neurology

## 2022-02-04 ENCOUNTER — Ambulatory Visit (INDEPENDENT_AMBULATORY_CARE_PROVIDER_SITE_OTHER): Payer: BC Managed Care – PPO

## 2022-02-04 VITALS — BP 100/70 | HR 107 | Temp 98.5°F | Resp 16 | Ht 64.0 in | Wt 178.8 lb

## 2022-02-04 DIAGNOSIS — G35 Multiple sclerosis: Secondary | ICD-10-CM | POA: Diagnosis not present

## 2022-02-04 MED ORDER — SODIUM CHLORIDE 0.9 % IV SOLN: INTRAVENOUS | Status: DC

## 2022-02-04 MED ORDER — SODIUM CHLORIDE 0.9 % IV SOLN
300.0000 mg | Freq: Once | INTRAVENOUS | Status: AC
  Administered 2022-02-04: 300 mg via INTRAVENOUS
  Filled 2022-02-04: qty 15

## 2022-02-04 MED ORDER — LORATADINE 10 MG PO TABS: 10.0000 mg | ORAL_TABLET | Freq: Once | ORAL | Status: DC

## 2022-02-04 MED ORDER — ACETAMINOPHEN 325 MG PO TABS: 650.0000 mg | ORAL_TABLET | Freq: Once | ORAL | Status: DC

## 2022-02-04 NOTE — Progress Notes (Signed)
Diagnosis: Multiple Sclerosis  Provider:  Marshell Garfinkel MD  Procedure: Infusion  IV Type: Peripheral, IV Location: L Antecubital  Tysabri (Natalizumab), Dose: 300 mg  Infusion Start Time: 1537  Infusion Stop Time: 9432  Post Infusion IV Care: Patient declined observation and Peripheral IV Discontinued  Discharge: Condition: Good, Destination: Home . AVS provided to patient.   Performed by:  Koren Shiver, RN

## 2022-02-05 ENCOUNTER — Encounter: Payer: Self-pay | Admitting: Neurology

## 2022-03-08 ENCOUNTER — Ambulatory Visit (INDEPENDENT_AMBULATORY_CARE_PROVIDER_SITE_OTHER): Payer: BC Managed Care – PPO

## 2022-03-08 VITALS — BP 110/75 | HR 88 | Temp 97.5°F | Resp 16 | Ht 64.0 in | Wt 181.0 lb

## 2022-03-08 DIAGNOSIS — G35 Multiple sclerosis: Secondary | ICD-10-CM | POA: Diagnosis not present

## 2022-03-08 MED ORDER — SODIUM CHLORIDE 0.9 % IV SOLN: INTRAVENOUS | Status: DC

## 2022-03-08 MED ORDER — SODIUM CHLORIDE 0.9 % IV SOLN
300.0000 mg | Freq: Once | INTRAVENOUS | Status: AC
  Administered 2022-03-08: 300 mg via INTRAVENOUS
  Filled 2022-03-08: qty 15

## 2022-03-08 MED ORDER — LORATADINE 10 MG PO TABS: 10.0000 mg | ORAL_TABLET | Freq: Once | ORAL | Status: DC

## 2022-03-08 MED ORDER — ACETAMINOPHEN 325 MG PO TABS: 650.0000 mg | ORAL_TABLET | Freq: Once | ORAL | Status: DC

## 2022-03-08 NOTE — Progress Notes (Signed)
Diagnosis: Multiple Sclerosis  Provider:  Marshell Garfinkel MD  Procedure: Infusion  IV Type: Peripheral, IV Location: L Antecubital  Tysabri (Natalizumab), Dose: 300 mg  Infusion Start Time: 5027  Infusion Stop Time: 1610  Post Infusion IV Care: Patient declined observation and Peripheral IV Discontinued  Discharge: Condition: Good, Destination: Home . AVS provided to patient.   Performed by:  Koren Shiver, RN

## 2022-04-06 ENCOUNTER — Ambulatory Visit: Payer: BC Managed Care – PPO

## 2022-04-14 ENCOUNTER — Ambulatory Visit: Payer: BC Managed Care – PPO

## 2022-04-14 ENCOUNTER — Ambulatory Visit (INDEPENDENT_AMBULATORY_CARE_PROVIDER_SITE_OTHER): Payer: BC Managed Care – PPO

## 2022-04-14 VITALS — BP 97/71 | HR 86 | Temp 97.5°F | Resp 18 | Ht 64.0 in | Wt 182.2 lb

## 2022-04-14 DIAGNOSIS — G35 Multiple sclerosis: Secondary | ICD-10-CM

## 2022-04-14 MED ORDER — LORATADINE 10 MG PO TABS: 10.0000 mg | ORAL_TABLET | Freq: Once | ORAL | Status: DC

## 2022-04-14 MED ORDER — SODIUM CHLORIDE 0.9 % IV SOLN
300.0000 mg | Freq: Once | INTRAVENOUS | Status: AC
  Administered 2022-04-14: 300 mg via INTRAVENOUS
  Filled 2022-04-14: qty 15

## 2022-04-14 MED ORDER — ACETAMINOPHEN 325 MG PO TABS: 650.0000 mg | ORAL_TABLET | Freq: Once | ORAL | Status: DC

## 2022-04-14 MED ORDER — SODIUM CHLORIDE 0.9 % IV SOLN: INTRAVENOUS | Status: DC

## 2022-04-14 NOTE — Progress Notes (Signed)
Diagnosis: Infusion  Provider:  Marshell Garfinkel MD  Procedure: Infusion  IV Type: Peripheral, IV Location: R Antecubital  Tysabri (Natalizumab), Dose: 300 mg  Infusion Start Time: 4584  Infusion Stop Time: 8350  Post Infusion IV Care: observation period declined. IV discontinued   Discharge: Condition: Good, Destination: Home . AVS provided to patient.   Performed by:  Adelina Mings, LPN

## 2022-05-12 ENCOUNTER — Ambulatory Visit (INDEPENDENT_AMBULATORY_CARE_PROVIDER_SITE_OTHER): Payer: BC Managed Care – PPO

## 2022-05-12 ENCOUNTER — Other Ambulatory Visit (INDEPENDENT_AMBULATORY_CARE_PROVIDER_SITE_OTHER): Payer: BC Managed Care – PPO

## 2022-05-12 VITALS — BP 104/74 | HR 92 | Temp 98.1°F | Resp 16 | Ht 64.0 in | Wt 184.0 lb

## 2022-05-12 DIAGNOSIS — G35 Multiple sclerosis: Secondary | ICD-10-CM | POA: Diagnosis not present

## 2022-05-12 LAB — CBC WITH DIFFERENTIAL/PLATELET
Basophils Absolute: 0 10*3/uL (ref 0.0–0.1)
Basophils Relative: 0.3 % (ref 0.0–3.0)
Eosinophils Absolute: 0.2 10*3/uL (ref 0.0–0.7)
Eosinophils Relative: 2.7 % (ref 0.0–5.0)
HCT: 35.6 % — ABNORMAL LOW (ref 36.0–46.0)
Hemoglobin: 12.3 g/dL (ref 12.0–15.0)
Lymphocytes Relative: 33.4 % (ref 12.0–46.0)
Lymphs Abs: 2 10*3/uL (ref 0.7–4.0)
MCHC: 34.5 g/dL (ref 30.0–36.0)
MCV: 90.5 fl (ref 78.0–100.0)
Monocytes Absolute: 0.7 10*3/uL (ref 0.1–1.0)
Monocytes Relative: 11.7 % (ref 3.0–12.0)
Neutro Abs: 3.1 10*3/uL (ref 1.4–7.7)
Neutrophils Relative %: 51.9 % (ref 43.0–77.0)
Platelets: 314 10*3/uL (ref 150.0–400.0)
RBC: 3.93 Mil/uL (ref 3.87–5.11)
RDW: 15 % (ref 11.5–15.5)
WBC: 6 10*3/uL (ref 4.0–10.5)

## 2022-05-12 LAB — COMPREHENSIVE METABOLIC PANEL
ALT: 23 U/L (ref 0–35)
AST: 21 U/L (ref 0–37)
Albumin: 4.2 g/dL (ref 3.5–5.2)
Alkaline Phosphatase: 76 U/L (ref 39–117)
BUN: 19 mg/dL (ref 6–23)
CO2: 28 mEq/L (ref 19–32)
Calcium: 9 mg/dL (ref 8.4–10.5)
Chloride: 102 mEq/L (ref 96–112)
Creatinine, Ser: 0.89 mg/dL (ref 0.40–1.20)
GFR: 80.19 mL/min (ref >60.00)
Glucose, Bld: 74 mg/dL (ref 70–99)
Potassium: 3.6 mEq/L (ref 3.5–5.1)
Sodium: 136 mEq/L (ref 135–145)
Total Bilirubin: 0.4 mg/dL (ref 0.2–1.2)
Total Protein: 6.8 g/dL (ref 6.0–8.3)

## 2022-05-12 MED ORDER — SODIUM CHLORIDE 0.9 % IV SOLN: INTRAVENOUS | Status: DC

## 2022-05-12 MED ORDER — SODIUM CHLORIDE 0.9 % IV SOLN
300.0000 mg | Freq: Once | INTRAVENOUS | Status: AC
  Administered 2022-05-12: 300 mg via INTRAVENOUS
  Filled 2022-05-12: qty 15

## 2022-05-12 MED ORDER — LORATADINE 10 MG PO TABS: 10.0000 mg | ORAL_TABLET | Freq: Once | ORAL | Status: DC

## 2022-05-12 MED ORDER — ACETAMINOPHEN 325 MG PO TABS: 650.0000 mg | ORAL_TABLET | Freq: Once | ORAL | Status: DC

## 2022-05-12 NOTE — Progress Notes (Signed)
Diagnosis: Multiple Sclerosis  Provider:  Marshell Garfinkel MD  Procedure: Infusion  IV Type: Peripheral, IV Location: L Antecubital  Tysabri (Natalizumab), Dose: 300 mg  Infusion Start Time: 8841  Infusion Stop Time: 1550  Post Infusion IV Care: Patient declined observation and Peripheral IV Discontinued  Discharge: Condition: Good, Destination: Home . AVS provided to patient.   Performed by:  Adelina Mings, LPN

## 2022-05-19 LAB — STRATIFY JCV AB (W/ INDEX) W/ RFLX
Index Value: 3.3 — ABNORMAL HIGH
Stratify JCV (TM) Ab w/Reflex Inhibition: POSITIVE — AB

## 2022-06-14 ENCOUNTER — Ambulatory Visit: Payer: BC Managed Care – PPO

## 2022-06-15 ENCOUNTER — Ambulatory Visit (INDEPENDENT_AMBULATORY_CARE_PROVIDER_SITE_OTHER): Payer: BC Managed Care – PPO

## 2022-06-15 VITALS — BP 104/74 | HR 91 | Temp 97.7°F | Resp 16 | Ht 64.0 in | Wt 185.4 lb

## 2022-06-15 DIAGNOSIS — G35 Multiple sclerosis: Secondary | ICD-10-CM

## 2022-06-15 MED ORDER — ACETAMINOPHEN 325 MG PO TABS: 650.0000 mg | ORAL_TABLET | Freq: Once | ORAL | Status: DC

## 2022-06-15 MED ORDER — SODIUM CHLORIDE 0.9 % IV SOLN: INTRAVENOUS | Status: DC

## 2022-06-15 MED ORDER — SODIUM CHLORIDE 0.9 % IV SOLN
300.0000 mg | Freq: Once | INTRAVENOUS | Status: AC
  Administered 2022-06-15: 300 mg via INTRAVENOUS
  Filled 2022-06-15: qty 15

## 2022-06-15 MED ORDER — LORATADINE 10 MG PO TABS: 10.0000 mg | ORAL_TABLET | Freq: Once | ORAL | Status: DC

## 2022-06-15 NOTE — Progress Notes (Signed)
Diagnosis: Multiple Sclerosis  Provider:  Marshell Garfinkel MD  Procedure: Infusion  IV Type: Peripheral, IV Location: L Antecubital  Tysabri (Natalizumab), Dose: 300 mg  Infusion Start Time: [id number]  Infusion Stop Time: [id number]  Post Infusion IV Care: Patient declined observation and Peripheral IV Discontinued  Discharge: Condition: Stable, Destination: Home . AVS Declined  Performed by:  Binnie Kand, RN

## 2022-07-08 ENCOUNTER — Encounter: Payer: Self-pay | Admitting: Family Medicine

## 2022-07-08 ENCOUNTER — Ambulatory Visit (INDEPENDENT_AMBULATORY_CARE_PROVIDER_SITE_OTHER): Payer: BC Managed Care – PPO | Admitting: Family Medicine

## 2022-07-08 ENCOUNTER — Ambulatory Visit
Admission: RE | Admit: 2022-07-08 | Discharge: 2022-07-08 | Disposition: A | Discharge status: Home / Self Care | Payer: BC Managed Care – PPO | Source: Ambulatory Visit | Attending: Family Medicine | Admitting: Family Medicine

## 2022-07-08 VITALS — BP 111/77 | HR 113 | Ht 64.0 in | Wt 182.0 lb

## 2022-07-08 DIAGNOSIS — R051 Acute cough: Secondary | ICD-10-CM | POA: Insufficient documentation

## 2022-07-08 MED ORDER — AZITHROMYCIN 500 MG PO TABS: 500.0000 mg | ORAL_TABLET | Freq: Every day | ORAL | 0 refills | Status: AC

## 2022-07-08 MED ORDER — PULSE OXIMETER MISC: 1.0000 | Freq: Every day | 0 refills | Status: AC | PRN

## 2022-07-08 MED ORDER — FLUTICASONE PROPIONATE 50 MCG/ACT NA SUSP: 2.0000 | Freq: Every day | NASAL | 6 refills | Status: AC

## 2022-07-08 MED ORDER — AMOXICILLIN-POT CLAVULANATE 875-125 MG PO TABS: 1.0000 | ORAL_TABLET | Freq: Two times a day (BID) | ORAL | 0 refills | Status: AC

## 2022-07-08 NOTE — Assessment & Plan Note (Signed)
2 days of cough, back pain with cough, and L ear pain.  Significant history of metastatic lung cancer, L lobectomy in 2022, and current radiation therapy. Not undergoing chemotherapy at this time.  Would favor viral respiratory process given her ear pain but given significant comorbidities have a low threshold to treat for CAP.   - CXR - augmentin and azithro - flonase - pulse oximeter to check for hypoxia.   - follow up as needed.

## 2022-07-08 NOTE — Patient Instructions (Signed)
I have prescribed you antibiotics to take for the next 5 days in case this is a bacterial infection.    I think it is more likely a viral Upper respiratory infection.  Using flonase once a day for the next two weeks may help with your ear pain.    I have ordered a chest x ray that you can get at [facility] imaging. Please call them ahead of time to see if you need an appointment.    Have a great day,   Dr. Jeannine Kitten

## 2022-07-08 NOTE — Progress Notes (Signed)
   Acute Office Visit  Subjective:     Patient ID: Donna Higgins, female    DOB: 29-Oct-1980, 42 y.o.   MRN: [id number]  Chief Complaint  Patient presents with   Cough   Otalgia    HPI Patient is in today for cough x 2 days.  Complains of sore back and chest when she coughs.  States that cough is nonproductive.  Also complaining of left ear pain, mostly at night, also for 2 days.  Feels like there is a "squeezing in the ear ".  No decreased hearing.  No rhinorrhea.  Denies fevers or chills.  No history of seasonal allergies.  patient has a history of lung cancer and is status post left lobectomy in 2022.  Currently undergoing radiation therapy but no chemotherapy.  She is treated with natalizumab for multiple sclerosis   ROS      Objective:    BP 111/77   Pulse (!) 113   Ht 5\' 4"  (1.626 m)   Wt 182 lb (82.6 kg)   SpO2 99% Comment: on RA  BMI 31.24 kg/m    Physical Exam General: Alert, oriented HEENT: Normal tympanic membranes bilaterally CV: Regular rate and rhythm Pulmonary: No wheezing, no rhonchi.  Patient coughing.  No results found for any visits on 07/08/22.      Assessment & Plan:   Problem List Items Addressed This Visit       Other   Acute cough - Primary    2 days of cough, back pain with cough, and L ear pain.  Significant history of metastatic lung cancer, L lobectomy in 2022, and current radiation therapy. Not undergoing chemotherapy at this time.  Would favor viral respiratory process given her ear pain but given significant comorbidities have a low threshold to treat for CAP.   - CXR - augmentin and azithro - flonase - pulse oximeter to check for hypoxia.   - follow up as needed.        Relevant Orders   DG Chest 2 View (Completed)    Meds ordered this encounter  Medications   amoxicillin-clavulanate (AUGMENTIN) 875-125 MG tablet    Sig: Take 1 tablet by mouth 2 (two) times daily for 5 days.    Dispense:  10 tablet    Refill:  0    azithromycin (ZITHROMAX) 500 MG tablet    Sig: Take 1 tablet (500 mg total) by mouth daily for 5 days.    Dispense:  5 tablet    Refill:  0   fluticasone (FLONASE) 50 MCG/ACT nasal spray    Sig: Place 2 sprays into both nostrils daily.    Dispense:  16 g    Refill:  6   Misc. Devices (PULSE OXIMETER) MISC    Sig: 1 each by Does not apply route daily as needed. Check your oxygen saturations once a day while you are being treated.    Dispense:  1 each    Refill:  0    Return if symptoms worsen or fail to improve.  Benay Pike, MD

## 2022-07-09 ENCOUNTER — Encounter: Payer: Self-pay | Admitting: Family Medicine

## 2022-07-13 ENCOUNTER — Ambulatory Visit (INDEPENDENT_AMBULATORY_CARE_PROVIDER_SITE_OTHER): Payer: BC Managed Care – PPO

## 2022-07-13 VITALS — BP 94/68 | HR 83 | Temp 97.9°F | Resp 16 | Ht 64.0 in | Wt 183.6 lb

## 2022-07-13 DIAGNOSIS — G35 Multiple sclerosis: Secondary | ICD-10-CM

## 2022-07-13 MED ORDER — SODIUM CHLORIDE 0.9 % IV SOLN: INTRAVENOUS | Status: DC

## 2022-07-13 MED ORDER — LORATADINE 10 MG PO TABS: 10.0000 mg | ORAL_TABLET | Freq: Once | ORAL | Status: DC

## 2022-07-13 MED ORDER — SODIUM CHLORIDE 0.9 % IV SOLN
300.0000 mg | Freq: Once | INTRAVENOUS | Status: AC
  Administered 2022-07-13: 300 mg via INTRAVENOUS
  Filled 2022-07-13: qty 15

## 2022-07-13 MED ORDER — ACETAMINOPHEN 325 MG PO TABS: 650.0000 mg | ORAL_TABLET | Freq: Once | ORAL | Status: DC

## 2022-07-13 NOTE — Progress Notes (Signed)
Diagnosis: Multiple Sclerosis  Provider:  Marshell Garfinkel MD  Procedure: Infusion  IV Type: Peripheral, IV Location: L Antecubital  Tysabri (Natalizumab), Dose: 300 mg  Infusion Start Time: [id number]  pm  Infusion Stop Time: 1530 pm  Post Infusion IV Care: Patient declined observation and Peripheral IV Discontinued  Discharge: Condition: Good, Destination: Home . AVS Declined  Performed by:  Adelina Mings, LPN

## 2022-08-09 ENCOUNTER — Ambulatory Visit (INDEPENDENT_AMBULATORY_CARE_PROVIDER_SITE_OTHER): Payer: BC Managed Care – PPO

## 2022-08-09 VITALS — BP 102/63 | HR 88 | Temp 97.5°F | Resp 18 | Ht 64.0 in | Wt 180.8 lb

## 2022-08-09 DIAGNOSIS — G35 Multiple sclerosis: Secondary | ICD-10-CM

## 2022-08-09 MED ORDER — ACETAMINOPHEN 325 MG PO TABS: 650.0000 mg | ORAL_TABLET | Freq: Once | ORAL | Status: DC

## 2022-08-09 MED ORDER — SODIUM CHLORIDE 0.9 % IV SOLN: INTRAVENOUS | Status: DC

## 2022-08-09 MED ORDER — LORATADINE 10 MG PO TABS: 10.0000 mg | ORAL_TABLET | Freq: Once | ORAL | Status: DC

## 2022-08-09 MED ORDER — SODIUM CHLORIDE 0.9 % IV SOLN
300.0000 mg | Freq: Once | INTRAVENOUS | Status: AC
  Administered 2022-08-09: 300 mg via INTRAVENOUS
  Filled 2022-08-09: qty 15

## 2022-08-09 NOTE — Progress Notes (Signed)
Diagnosis: Multiple Sclerosis  Provider:  Chilton Greathouse MD  Procedure: IV Infusion  IV Type: Peripheral, IV Location: R Antecubital  Tysabri (Natalizumab), Dose: 300 mg  Infusion Start Time: 1448  Infusion Stop Time: 1554  Post Infusion IV Care: Patient declined observation and Peripheral IV Discontinued  Discharge: Condition: Good, Destination: Home . AVS Declined  Performed by:  Loney Hering, LPN

## 2022-09-07 ENCOUNTER — Ambulatory Visit (INDEPENDENT_AMBULATORY_CARE_PROVIDER_SITE_OTHER): Payer: BC Managed Care – PPO

## 2022-09-07 VITALS — BP 103/70 | HR 83 | Temp 97.9°F | Resp 16 | Ht 64.0 in | Wt 179.2 lb

## 2022-09-07 DIAGNOSIS — G35 Multiple sclerosis: Secondary | ICD-10-CM

## 2022-09-07 MED ORDER — LORATADINE 10 MG PO TABS: 10.0000 mg | ORAL_TABLET | Freq: Once | ORAL | Status: DC

## 2022-09-07 MED ORDER — SODIUM CHLORIDE 0.9 % IV SOLN
300.0000 mg | Freq: Once | INTRAVENOUS | Status: AC
  Administered 2022-09-07: 300 mg via INTRAVENOUS
  Filled 2022-09-07: qty 15

## 2022-09-07 MED ORDER — SODIUM CHLORIDE 0.9 % IV SOLN: INTRAVENOUS | Status: DC

## 2022-09-07 MED ORDER — ACETAMINOPHEN 325 MG PO TABS: 650.0000 mg | ORAL_TABLET | Freq: Once | ORAL | Status: DC

## 2022-09-07 NOTE — Progress Notes (Signed)
Diagnosis: Multiple Sclerosis  Provider:  Chilton Greathouse MD  Procedure: IV Infusion  IV Type: Peripheral, IV Location: L Antecubital  Tysabri (Natalizumab), Dose: 300 mg  Infusion Start Time: 1506  Infusion Stop Time: 1611  Post Infusion IV Care: Patient declined observation and Peripheral IV Discontinued  Discharge: Condition: Good, Destination: Home . AVS Declined  Performed by:  Loney Hering, LPN

## 2022-09-10 ENCOUNTER — Ambulatory Visit
Admission: EM | Admit: 2022-09-10 | Discharge: 2022-09-10 | Disposition: A | Discharge status: Home / Self Care | Payer: BC Managed Care – PPO | Attending: Internal Medicine | Admitting: Internal Medicine

## 2022-09-10 ENCOUNTER — Encounter: Payer: Self-pay | Admitting: Emergency Medicine

## 2022-09-10 DIAGNOSIS — B349 Viral infection, unspecified: Secondary | ICD-10-CM | POA: Insufficient documentation

## 2022-09-10 DIAGNOSIS — J029 Acute pharyngitis, unspecified: Secondary | ICD-10-CM | POA: Insufficient documentation

## 2022-09-10 LAB — POCT RAPID STREP A (OFFICE): Rapid Strep A Screen: NEGATIVE

## 2022-09-10 MED ORDER — CHLORASEPTIC 1.4 % MT LIQD: 1.0000 | OROMUCOSAL | 0 refills | Status: AC | PRN

## 2022-09-10 NOTE — ED Triage Notes (Signed)
Patient c/o sore throat x 2 days, no other sx's.  Denies any fever.  Patient has taken OTC allergy meds.

## 2022-09-10 NOTE — Discharge Instructions (Signed)
I have prescribed a throat spray to alleviate discomfort.  Rapid strep is negative.  Throat culture is pending.  Will call if it is abnormal.  Please follow-up if any symptoms persist or worsen.

## 2022-09-10 NOTE — ED Provider Notes (Signed)
[name]    CSN: [id number] Arrival date & time: 09/10/22  1655      History   Chief Complaint Chief Complaint  Patient presents with   Sore Throat    HPI Donna Higgins is a 42 y.o. female.   Patient presents with sore throat that has been present for 2 days.  Patient reports some mild ear discomfort in the right ear but denies nasal congestion, runny nose, cough, fever.  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  Patient has taken an over-the-counter allergy medication that she is not sure the name.   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   Hilar lymphadenopathy 08/05/2020   History of smoking less than 10 pack years 08/05/2020   Encounter to establish care 08/05/2020   Allergic rhinitis 07/17/2015   Routine general medical examination at a health care facility 05/22/2012   Vaginal birth after cesarean (5/11) 08/22/2011    Past Surgical History:  Procedure Laterality Date   BRONCHIAL BIOPSY  09/15/2020   Procedure: BRONCHIAL BIOPSIES;  Surgeon: Leslye Peer, MD;  Location: [facility] ENDOSCOPY;  Service: Pulmonary;;   BRONCHIAL BRUSHINGS  09/15/2020   Procedure: BRONCHIAL BRUSHINGS;  Surgeon: Leslye Peer, MD;  Location: [facility] ENDOSCOPY;  Service: Pulmonary;;   BRONCHIAL NEEDLE ASPIRATION BIOPSY  09/15/2020   Procedure: BRONCHIAL NEEDLE ASPIRATION BIOPSIES;  Surgeon: Leslye Peer, MD;  Location: [facility] ENDOSCOPY;  Service: Pulmonary;;   BRONCHIAL WASHINGS  09/15/2020   Procedure: BRONCHIAL WASHINGS;  Surgeon: Leslye Peer, MD;  Location: [facility] ENDOSCOPY;  Service: Pulmonary;;   CESAREAN SECTION  04/13/2007   LUNG REMOVAL, PARTIAL Left    LUNG SURGERY  12/05/2020   left lung removed   LYMPH NODE BIOPSY Right 07/08/2021   VIDEO BRONCHOSCOPY WITH ENDOBRONCHIAL NAVIGATION Left 09/15/2020   Procedure: VIDEO BRONCHOSCOPY WITH ENDOBRONCHIAL NAVIGATION;  Surgeon: Leslye Peer, MD;  Location: [facility] ENDOSCOPY;  Service: Pulmonary;  Laterality: Left;   VIDEO BRONCHOSCOPY WITH ENDOBRONCHIAL ULTRASOUND Left 09/15/2020   Procedure: VIDEO BRONCHOSCOPY WITH ENDOBRONCHIAL ULTRASOUND;  Surgeon: Leslye Peer, MD;  Location: [facility] ENDOSCOPY;  Service: Pulmonary;  Laterality: Left;   WISDOM TOOTH EXTRACTION      OB History     Gravida  2   Para  2   Term  2   Preterm      AB      Living  2      SAB      IAB      Ectopic      Multiple      Live Births  2            Home Medications    Prior to Admission medications   Medication Sig Start Date End Date Taking? Authorizing Provider  ALPRAZolam Prudy Feeler) 0.25 MG tablet  Take 1 tablet (0.25 mg total) by mouth 2 (two) times daily as needed for anxiety. 06/25/21  Yes Boscia, Kathlynn Grate, NP  Beta Carotene (VITAMIN A) 25000 UNIT capsule Take 25,000 Units by mouth daily.   Yes [provider]  cholecalciferol (VITAMIN D3) 25 MCG (1000 UNIT) tablet Take 2,000 Units by mouth daily.   Yes [provider]  fluticasone (FLONASE) 50 MCG/ACT nasal spray Place 2 sprays into both nostrils daily. 07/08/22  Yes Sandre Kitty, MD  natalizumab (TYSABRI) 300 MG/15ML injection Inject 300 mg into the vein every 28 (twenty-eight) days.   Yes [provider]  omeprazole (PRILOSEC) 20 MG capsule Take 20 mg by mouth daily.   Yes [provider]  phenol (CHLORASEPTIC) 1.4 % LIQD Use as directed 1  spray in the mouth or throat as needed for throat irritation / pain. 09/10/22  Yes , Rolly Salter E, FNP  dexamethasone (DECADRON) 4 MG tablet Take 4 mg by mouth. Takes 1 tablet each day before she starts chemotherapy( 4 days every 3 weeks) Patient not taking: Reported on 07/08/2022 07/21/21   [provider]  Misc. Devices (PULSE OXIMETER) MISC 1 each by Does not apply route daily as needed. Check your oxygen saturations once a day while you are being treated. 07/08/22   Sandre Kitty, MD  OLANZapine (ZYPREXA) 5 MG tablet Take 5 mg by mouth at bedtime. 4 nights every three weeks, takes 5mg  Patient not taking: Reported on 07/08/2022 07/21/21   [provider]  prochlorperazine (COMPAZINE) 10 MG tablet Take 10 mg by mouth every 6 (six) hours as needed. Patient not taking: Reported on 07/08/2022 07/21/21   [provider]    Family History Family History  Problem Relation Age of Onset   Urolithiasis Mother    Hypertension Mother    Hyperlipidemia Mother    Cancer Maternal Aunt        breast   Diabetes Maternal Grandmother     Social History Social History   Tobacco Use   Smoking status: Former    Types: Cigarettes    Quit date: 2022    Years since quitting: 2.4   Smokeless tobacco: Never   Tobacco comments:    1-2 a day  Vaping Use   Vaping Use: Never used  Substance Use Topics   Alcohol use: Not Currently    Alcohol/week: 4.0 standard drinks of alcohol    Types: 4 Cans of beer per week   Drug use: No     Allergies   Stadol [butorphanol tartrate]   Review of Systems Review of Systems Per HPI  Physical Exam Triage Vital Signs ED Triage Vitals  Enc Vitals Group     BP 09/10/22 1702 101/69     Pulse Rate 09/10/22 1702 96     Resp 09/10/22 1702 16     Temp 09/10/22 1702 97.6 F (36.4 C)     Temp Source 09/10/22 1702 Oral     SpO2 09/10/22 1702 98 %     Weight 09/10/22 1703 175 lb (79.4 kg)     Height 09/10/22 1703 5\' 4"  (1.626 m)     Head  Circumference --      Peak Flow --      Pain Score 09/10/22 1703 3     Pain Loc --      Pain Edu? --      Excl. in GC? --    No data found.  Updated Vital Signs BP 101/69 (BP  Location: Left Arm)   Pulse 96   Temp 97.6 F (36.4 C) (Oral)   Resp 16   Ht 5\' 4"  (1.626 m)   Wt 175 lb (79.4 kg)   LMP 09/03/2022 (Exact Date)   SpO2 98%   BMI 30.04 kg/m   Visual Acuity Right Eye Distance:   Left Eye Distance:   Bilateral Distance:    Right Eye Near:   Left Eye Near:    Bilateral Near:     Physical Exam Constitutional:      General: She is not in acute distress.    Appearance: Normal appearance. She is not toxic-appearing or diaphoretic.  HENT:     Head: Normocephalic and atraumatic.     Right Ear: Ear canal normal. No drainage, swelling or tenderness. A middle ear effusion is present. Tympanic membrane is not perforated, erythematous or bulging.     Left Ear: Ear canal normal. No drainage, swelling or tenderness. A middle ear effusion is present. Tympanic membrane is not perforated, erythematous or bulging.     Nose: No congestion.     Mouth/Throat:     Mouth: Mucous membranes are moist.     Pharynx: Posterior oropharyngeal erythema present.  Eyes:     Extraocular Movements: Extraocular movements intact.     Conjunctiva/sclera: Conjunctivae normal.     Pupils: Pupils are equal, round, and reactive to light.  Cardiovascular:     Rate and Rhythm: Normal rate and regular rhythm.     Pulses: Normal pulses.     Heart sounds: Normal heart sounds.  Pulmonary:     Effort: Pulmonary effort is normal. No respiratory distress.     Breath sounds: Normal breath sounds. No wheezing.  Abdominal:     General: Abdomen is flat. Bowel sounds are normal.     Palpations: Abdomen is soft.  Musculoskeletal:        General: Normal range of motion.     Cervical back: Normal range of motion.  Skin:    General: Skin is warm and dry.  Neurological:     General: No focal deficit present.      Mental Status: She is alert and oriented to person, place, and time. Mental status is at baseline.  Psychiatric:        Mood and Affect: Mood normal.        Behavior: Behavior normal.      UC Treatments / Results  Labs (all labs ordered are listed, but only abnormal results are displayed) Labs Reviewed  CULTURE, GROUP A STREP [facility])  POCT RAPID STREP A (OFFICE)    EKG   Radiology No results found.  Procedures Procedures (including critical care time)  Medications Ordered in UC Medications - No data to display  Initial Impression / Assessment and Plan / UC Course  I have reviewed the triage vital signs and the nursing notes.  Pertinent labs & imaging results that were available during my care of the patient were reviewed by me and considered in my medical decision making (see chart for details).     Suspect allergy related symptoms versus viral illness.  Rapid strep is negative.  Throat culture pending.  No signs of bacterial infection on exam that would necessitate antibiotic therapy. Advised patient to continue antihistamine and Flonase to help alleviate fluid behind eardrums.  Will avoid prednisone given patient takes MS injection that could interact and cause immunosuppression.  Advised strict follow-up if any symptoms persist or worsen.  Patient verbalized understanding and was  agreeable with plan. Final Clinical Impressions(s) / UC Diagnoses   Final diagnoses:  Sore throat  Viral illness     Discharge Instructions      I have prescribed a throat spray to alleviate discomfort.  Rapid strep is negative.  Throat culture is pending.  Will call if it is abnormal.  Please follow-up if any symptoms persist or worsen.    ED Prescriptions     Medication Sig Dispense Auth. Provider   phenol (CHLORASEPTIC) 1.4 % LIQD Use as directed 1 spray in the mouth or throat as needed for throat irritation / pain. 118 mL Gustavus Bryant, [place]      PDMP not reviewed this  encounter.   Gustavus Bryant, [place] 09/10/22 [phone]

## 2022-09-13 LAB — CULTURE, GROUP A STREP (THRC)

## 2022-10-02 ENCOUNTER — Encounter: Payer: Self-pay | Admitting: Neurology

## 2022-10-04 ENCOUNTER — Encounter: Payer: Self-pay | Admitting: Neurology

## 2022-10-05 LAB — HM MAMMOGRAPHY

## 2023-04-05 ENCOUNTER — Encounter (HOSPITAL_COMMUNITY): Payer: Self-pay

## 2023-04-05 ENCOUNTER — Other Ambulatory Visit: Payer: Self-pay

## 2023-04-05 ENCOUNTER — Emergency Department (HOSPITAL_COMMUNITY)
Admission: EM | Admit: 2023-04-05 | Discharge: 2023-04-06 | Disposition: A | Discharge status: Home / Self Care | Payer: BC Managed Care – PPO | Attending: Emergency Medicine | Admitting: Emergency Medicine

## 2023-04-05 ENCOUNTER — Emergency Department (HOSPITAL_COMMUNITY): Payer: BC Managed Care – PPO

## 2023-04-05 DIAGNOSIS — Z20822 Contact with and (suspected) exposure to covid-19: Secondary | ICD-10-CM | POA: Insufficient documentation

## 2023-04-05 DIAGNOSIS — J101 Influenza due to other identified influenza virus with other respiratory manifestations: Secondary | ICD-10-CM | POA: Diagnosis not present

## 2023-04-05 DIAGNOSIS — Z85118 Personal history of other malignant neoplasm of bronchus and lung: Secondary | ICD-10-CM | POA: Insufficient documentation

## 2023-04-05 DIAGNOSIS — R0602 Shortness of breath: Secondary | ICD-10-CM | POA: Diagnosis present

## 2023-04-05 DIAGNOSIS — R000 Tachycardia, unspecified: Secondary | ICD-10-CM | POA: Diagnosis not present

## 2023-04-05 DIAGNOSIS — G35 Multiple sclerosis: Secondary | ICD-10-CM | POA: Diagnosis not present

## 2023-04-05 LAB — CBC WITH DIFFERENTIAL/PLATELET
Abs Immature Granulocytes: 0 10*3/uL (ref 0.00–0.07)
Basophils Absolute: 0 10*3/uL (ref 0.0–0.1)
Basophils Relative: 0 %
Eosinophils Absolute: 0.1 10*3/uL (ref 0.0–0.5)
Eosinophils Relative: 3 %
HCT: 41.4 % (ref 36.0–46.0)
Hemoglobin: 14 g/dL (ref 12.0–15.0)
Immature Granulocytes: 0 %
Lymphocytes Relative: 18 %
Lymphs Abs: 0.6 10*3/uL — ABNORMAL LOW (ref 0.7–4.0)
MCH: 31.4 pg (ref 26.0–34.0)
MCHC: 33.8 g/dL (ref 30.0–36.0)
MCV: 92.8 fL (ref 80.0–100.0)
Monocytes Absolute: 0.7 10*3/uL (ref 0.1–1.0)
Monocytes Relative: 20 %
Neutro Abs: 2 10*3/uL (ref 1.7–7.7)
Neutrophils Relative %: 59 %
Platelets: 235 10*3/uL (ref 150–400)
RBC: 4.46 MIL/uL (ref 3.87–5.11)
RDW: 13.1 % (ref 11.5–15.5)
WBC: 3.4 10*3/uL — ABNORMAL LOW (ref 4.0–10.5)
nRBC: 0 % (ref 0.0–0.2)

## 2023-04-05 LAB — RESP PANEL BY RT-PCR (RSV, FLU A&B, COVID)  RVPGX2
Influenza A by PCR: POSITIVE — AB
Influenza B by PCR: NEGATIVE
Resp Syncytial Virus by PCR: NEGATIVE
SARS Coronavirus 2 by RT PCR: NEGATIVE

## 2023-04-05 MED ORDER — ONDANSETRON HCL 4 MG/2ML IJ SOLN
4.0000 mg | Freq: Once | INTRAMUSCULAR | Status: AC
  Administered 2023-04-05: 4 mg via INTRAVENOUS
  Filled 2023-04-05: qty 2

## 2023-04-05 MED ORDER — HYDROMORPHONE HCL 1 MG/ML IJ SOLN
0.5000 mg | Freq: Once | INTRAMUSCULAR | Status: AC
  Administered 2023-04-05: 0.5 mg via INTRAVENOUS
  Filled 2023-04-05: qty 1

## 2023-04-05 MED ORDER — HYDROMORPHONE HCL 1 MG/ML IJ SOLN: 1.0000 mg | Freq: Once | INTRAMUSCULAR | Status: DC

## 2023-04-05 MED ORDER — SODIUM CHLORIDE 0.9 % IV BOLUS
1000.0000 mL | Freq: Once | INTRAVENOUS | Status: AC
  Administered 2023-04-05: 1000 mL via INTRAVENOUS

## 2023-04-05 MED ORDER — OSELTAMIVIR PHOSPHATE 75 MG PO CAPS
75.0000 mg | ORAL_CAPSULE | Freq: Once | ORAL | Status: AC
  Administered 2023-04-05: 75 mg via ORAL
  Filled 2023-04-05: qty 1

## 2023-04-05 NOTE — ED Provider Notes (Signed)
[facility] EMERGENCY DEPARTMENT AT [facility] Provider Note   CSN: [id number] Arrival date & time: 04/05/23  2231     History {Add pertinent medical, surgical, social history, OB history to HPI:1} Chief Complaint  Patient presents with   Shortness of Breath   Chest Pain    Donna Higgins is a 42 y.o. female.  42 year old female presents to the emergency department for upper respiratory symptoms.  States that symptoms began yesterday evening and have been constant. Patient c/o sore throat, odynophagia.  She has some bilateral ear pressure as well as central chest heaviness.  There is associated pleuritic chest pain in the anterior and posterior chest with breathing.  This has made the patient feel short of breath at times.  Pain is specifically aggravated with coughing.  She has tried Psychiatrist for cough management.  Taking Tylenol with minimal pain relief.  She denies fever, sick contacts, leg swelling, lightheadedness, syncope, hemoptysis.  Is not on chronic anticoagulation and denies a history of DVT/PE.  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 Is not currently undergoing chemotherapy. Receiving all of her cancer therapy at [facility]  The history is provided by the patient. No language interpreter was used.  Shortness of Breath Associated symptoms: chest pain   Chest Pain Associated symptoms: shortness of breath        Home Medications Prior to Admission medications   Medication Sig Start Date End Date Taking? Authorizing Provider  ALPRAZolam (XANAX) 0.25 MG tablet Take 1 tablet (0.25 mg total) by mouth 2 (two) times daily as needed for anxiety. 06/25/21   Carlean Jews, NP  Beta Carotene (VITAMIN A) 25000 UNIT capsule Take 25,000 Units by mouth daily.    [provider]  cholecalciferol (VITAMIN D3) 25 MCG  (1000 UNIT) tablet Take 2,000 Units by mouth daily.    [provider]  dexamethasone (DECADRON) 4 MG tablet Take 4 mg by mouth. Takes 1 tablet each day before she starts chemotherapy( 4 days every 3 weeks) Patient not taking: Reported on 07/08/2022 07/21/21   [provider]  fluticasone (FLONASE) 50 MCG/ACT nasal spray Place 2 sprays into both nostrils daily. 07/08/22   Sandre Kitty, MD  Misc. Devices (PULSE OXIMETER) MISC 1 each by Does not apply route daily as needed. Check your oxygen saturations once a day while you are being treated. 07/08/22   Sandre Kitty, MD  natalizumab (TYSABRI) 300 MG/15ML injection Inject 300 mg into the vein every 28 (twenty-eight) days.    [provider]  OLANZapine (ZYPREXA) 5 MG tablet Take 5 mg by mouth at bedtime. 4 nights every three weeks, takes 5mg  Patient not taking: Reported on 07/08/2022 07/21/21   [provider]  omeprazole (PRILOSEC) 20 MG capsule Take 20 mg by mouth daily.    [provider]  phenol (CHLORASEPTIC) 1.4 % LIQD Use as directed 1 spray in the mouth or throat as needed for throat irritation / pain. 09/10/22   Gustavus Bryant, FNP  prochlorperazine (COMPAZINE) 10 MG tablet Take 10 mg by mouth every 6 (six) hours as needed. Patient not taking: Reported on 07/08/2022 07/21/21   [provider]      Allergies    Stadol [butorphanol tartrate]    Review of Systems   Review of Systems  Respiratory:  Positive for shortness of breath.  Cardiovascular:  Positive for chest pain.  Ten systems reviewed and are negative for acute change, except as noted in the HPI.    Physical Exam Updated Vital Signs BP 109/77   Pulse (!) 108   Temp 97.6 F (36.4 C)   Resp (!) 23   Ht 5\' 4"  (1.626 m)   Wt 82.6 kg   LMP 04/01/2023 (Exact Date)   SpO2 100%   BMI 31.24 kg/m   Physical Exam Vitals and nursing note reviewed.  Constitutional:      General: She is not in acute distress.    Appearance:  She is well-developed. She is not diaphoretic.     Comments: Appears uncomfortable  HENT:     Head: Normocephalic and atraumatic.     Right Ear: External ear normal.     Left Ear: External ear normal.     Mouth/Throat:     Mouth: Mucous membranes are moist.     Comments: Mild posterior oropharyngeal erythema. Uvula midline. Tolerating secretions w/o difficulty. No exudates or oropharyngeal edema. Eyes:     General: No scleral icterus.    Conjunctiva/sclera: Conjunctivae normal.  Cardiovascular:     Rate and Rhythm: Regular rhythm. Tachycardia present.     Pulses: Normal pulses.  Pulmonary:     Breath sounds: No stridor. No wheezing, rhonchi or rales.     Comments: Transmitted breath sounds upon auscultation of the LUL/LLL consistent with history of pneumonectomy.  Otherwise no wheezing, rales, rhonchi.  Oxygen saturations are 100% on room air. Splinting on inspiration, tachypnea noted. Abdominal:     General: There is no distension.  Musculoskeletal:        General: Normal range of motion.     Cervical back: Normal range of motion.  Skin:    General: Skin is warm and dry.     Coloration: Skin is not pale.     Findings: No erythema or rash.  Neurological:     Mental Status: She is alert and oriented to person, place, and time.     Coordination: Coordination normal.     Comments: Moving all extremities spontaneously.  Psychiatric:        Behavior: Behavior normal.     ED Results / Procedures / Treatments   Labs (all labs ordered are listed, but only abnormal results are displayed) Labs Reviewed  RESP PANEL BY RT-PCR (RSV, FLU A&B, COVID)  [id number]  LACTIC ACID, PLASMA  CBC WITH DIFFERENTIAL/PLATELET  COMPREHENSIVE METABOLIC PANEL    EKG None  Radiology No results found.  Procedures Procedures  {Document cardiac monitor, telemetry assessment procedure when appropriate:1}  Medications Ordered in ED Medications  ondansetron (ZOFRAN) injection 4 mg (has no  administration in time range)  sodium chloride 0.9 % bolus 1,000 mL (has no administration in time range)  HYDROmorphone (DILAUDID) injection 0.5 mg (has no administration in time range)    ED Course/ Medical Decision Making/ A&P   {   Click here for ABCD2, HEART and other calculatorsREFRESH Note before signing :1}                              Medical Decision Making Amount and/or Complexity of Data Reviewed Labs: ordered. Radiology: ordered.  Risk Prescription drug management.   ***  {Document critical care time when appropriate:1} {Document review of labs and clinical decision tools ie heart score, Chads2Vasc2 etc:1}  {Document your independent review of radiology images, and any outside records:1} {  Document your discussion with family members, caretakers, and with consultants:1} {Document social determinants of health affecting pt's care:1} {Document your decision making why or why not admission, treatments were needed:1} Final Clinical Impression(s) / ED Diagnoses Final diagnoses:  None    Rx / DC Orders ED Discharge Orders     None

## 2023-04-05 NOTE — ED Triage Notes (Signed)
Pt arrives with reports of SHOB that started with a cough las night and worsened today. Pt hoarse with speaking and reports hx of lung cancer and receiving tx with [name]. Pt reports pressure to chest. Pt had left lung removed 2 years ago.

## 2023-04-06 LAB — COMPREHENSIVE METABOLIC PANEL
ALT: 23 U/L (ref 0–44)
AST: 26 U/L (ref 15–41)
Albumin: 4.5 g/dL (ref 3.5–5.0)
Alkaline Phosphatase: 48 U/L (ref 38–126)
Anion gap: 7 (ref 5–15)
BUN: 14 mg/dL (ref 6–20)
CO2: 24 mmol/L (ref 22–32)
Calcium: 8.9 mg/dL (ref 8.9–10.3)
Chloride: 103 mmol/L (ref 98–111)
Creatinine, Ser: 0.74 mg/dL (ref 0.44–1.00)
GFR, Estimated: >60 mL/min (ref >60)
Glucose, Bld: 106 mg/dL — ABNORMAL HIGH (ref 70–99)
Potassium: 3.4 mmol/L — ABNORMAL LOW (ref 3.5–5.1)
Sodium: 134 mmol/L — ABNORMAL LOW (ref 135–145)
Total Bilirubin: 0.5 mg/dL (ref <1.2)
Total Protein: 7.5 g/dL (ref 6.5–8.1)

## 2023-04-06 LAB — LACTIC ACID, PLASMA: Lactic Acid, Venous: 1 mmol/L (ref 0.5–1.9)

## 2023-04-06 MED ORDER — OSELTAMIVIR PHOSPHATE 75 MG PO CAPS: 75.0000 mg | ORAL_CAPSULE | Freq: Two times a day (BID) | ORAL | 0 refills | Status: AC

## 2023-04-06 MED ORDER — HYDROCODONE BIT-HOMATROP MBR 5-1.5 MG/5ML PO SOLN: 5.0000 mL | Freq: Four times a day (QID) | ORAL | 0 refills | Status: AC | PRN

## 2023-04-06 MED ORDER — KETOROLAC TROMETHAMINE 15 MG/ML IJ SOLN
15.0000 mg | Freq: Once | INTRAMUSCULAR | Status: AC
  Administered 2023-04-06: 15 mg via INTRAVENOUS
  Filled 2023-04-06: qty 1

## 2023-04-06 NOTE — Discharge Instructions (Addendum)
Take Tamiflu as prescribed. You had been prescribed Hycodan to help with cough.  This should also help alleviate some of your chest discomfort.  Do not drive or drink alcohol after taking this medication as it may make you drowsy and impair your judgment.  You may take these medications along with Tylenol and/or ibuprofen for additional pain management or fever, should it develop.  Follow closely with your primary care doctor to ensure resolution of symptoms.  Low threshold for return to the emergency department should you develop worsening chest pain, worsening shortness of breath, lightheadedness, loss of consciousness, coughing up blood.

## 2023-04-11 LAB — CULTURE, BLOOD (ROUTINE X 2)
Culture: NO GROWTH
Culture: NO GROWTH
Special Requests: ADEQUATE

## 2023-08-11 ENCOUNTER — Other Ambulatory Visit: Payer: Self-pay | Admitting: Nurse Practitioner

## 2023-08-11 DIAGNOSIS — Z1231 Encounter for screening mammogram for malignant neoplasm of breast: Secondary | ICD-10-CM

## 2023-08-15 ENCOUNTER — Other Ambulatory Visit: Payer: Self-pay | Admitting: Oncology

## 2023-08-15 DIAGNOSIS — C7A8 Other malignant neuroendocrine tumors: Secondary | ICD-10-CM
